# Patient Record
Sex: Female | Born: 1975 | Race: White | Hispanic: No | Marital: Single
Health system: Midwestern US, Community
[De-identification: ages and names within clinical notes are randomized; demographics above are authoritative.]

## PROBLEM LIST (undated history)

## (undated) DIAGNOSIS — M25561 Pain in right knee: Secondary | ICD-10-CM

## (undated) DIAGNOSIS — Z01818 Encounter for other preprocedural examination: Secondary | ICD-10-CM

## (undated) DIAGNOSIS — N911 Secondary amenorrhea: Secondary | ICD-10-CM

## (undated) DIAGNOSIS — C519 Malignant neoplasm of vulva, unspecified: Secondary | ICD-10-CM

## (undated) DIAGNOSIS — F5104 Psychophysiologic insomnia: Secondary | ICD-10-CM

## (undated) DIAGNOSIS — D259 Leiomyoma of uterus, unspecified: Secondary | ICD-10-CM

## (undated) DIAGNOSIS — M48062 Spinal stenosis, lumbar region with neurogenic claudication: Secondary | ICD-10-CM

## (undated) DIAGNOSIS — N939 Abnormal uterine and vaginal bleeding, unspecified: Secondary | ICD-10-CM

## (undated) DIAGNOSIS — G8918 Other acute postprocedural pain: Secondary | ICD-10-CM

## (undated) DIAGNOSIS — A630 Anogenital (venereal) warts: Secondary | ICD-10-CM

## (undated) DIAGNOSIS — Z9889 Other specified postprocedural states: Secondary | ICD-10-CM

## (undated) DIAGNOSIS — O009 Unspecified ectopic pregnancy without intrauterine pregnancy: Secondary | ICD-10-CM

## (undated) DIAGNOSIS — C801 Malignant (primary) neoplasm, unspecified: Secondary | ICD-10-CM

## (undated) HISTORY — PX: BILATERAL SALPINGECTOMY: SHX5743

## (undated) HISTORY — PX: CATARACT EXTRACTION: SUR2

## (undated) HISTORY — PX: VULVA SURGERY: SHX837

## (undated) MED ORDER — OXYCODONE-ACETAMINOPHEN 5 MG-325 MG TAB
5-325 mg | ORAL_TABLET | ORAL | Status: DC | PRN
Start: ? — End: 2016-04-23

## (undated) MED ORDER — SENNOSIDES-DOCUSATE SODIUM 8.6 MG-50 MG TAB
ORAL_TABLET | Freq: Two times a day (BID) | ORAL | Status: DC
Start: ? — End: 2016-04-23

---

## 2011-10-27 LAB — HCG URINE, QL: HCG urine, QL: NEGATIVE

## 2011-10-27 LAB — URINALYSIS W/ RFLX MICROSCOPIC
Bilirubin: NEGATIVE
Glucose: NEGATIVE MG/DL
Ketone: NEGATIVE MG/DL
Leukocyte Esterase: NEGATIVE
Nitrites: NEGATIVE
Protein: NEGATIVE MG/DL
Specific gravity: 1.02 (ref 1.003–1.030)
Urobilinogen: 0.2 EU/DL (ref 0.2–1.0)
pH (UA): 7.5 (ref 5.0–8.0)

## 2011-10-27 LAB — CHLAMYDIA/GC PCR
Chlamydia amplified: NEGATIVE
N. gonorrhea, amplified: NEGATIVE

## 2011-10-27 LAB — URINE MICROSCOPIC ONLY
Bacteria: NEGATIVE /HPF
RBC: 4 /HPF (ref 0–5)
WBC: 4 /HPF (ref 0–4)

## 2011-10-27 LAB — WET PREP: Wet prep: NONE SEEN

## 2013-03-07 ENCOUNTER — Encounter

## 2013-03-07 LAB — TYPE AND SCREEN
ABO/Rh: O POS
Antibody Screen: NEGATIVE

## 2013-03-07 LAB — BETA HCG, QT
Beta HCG, QT: <2 m[IU]/mL (ref 0–10)
hCG Quant: <2 m[IU]/mL (ref 0–10)

## 2013-03-07 LAB — METABOLIC PANEL, BASIC
Anion gap: 4 mmol/L (ref 3.0–18)
BUN/Creatinine ratio: 11 — ABNORMAL LOW (ref 12–20)
BUN: 8 MG/DL (ref 7.0–18)
CO2: 30 mmol/L (ref 21–32)
Calcium: 8.3 MG/DL — ABNORMAL LOW (ref 8.5–10.1)
Chloride: 105 mmol/L (ref 100–108)
Creatinine: 0.75 MG/DL (ref 0.6–1.3)
GFR est AA: >60 mL/min/{1.73_m2} (ref >60)
GFR est non-AA: >60 mL/min/{1.73_m2} (ref >60)
Glucose: 89 mg/dL (ref 74–99)
Potassium: 3.5 mmol/L (ref 3.5–5.5)
Sodium: 139 mmol/L (ref 136–145)

## 2013-03-07 LAB — TYPE & SCREEN
ABO/Rh(D): O POS
Antibody screen: NEGATIVE

## 2013-03-07 LAB — EKG, 12 LEAD, INITIAL
Atrial Rate: 72 {beats}/min
Calculated P Axis: 16 degrees
Calculated R Axis: 55 degrees
Calculated T Axis: 21 degrees
Diagnosis: NORMAL
P-R Interval: 146 ms
Q-T Interval: 424 ms
QRS Duration: 86 ms
QTC Calculation (Bezet): 464 ms
Ventricular Rate: 72 {beats}/min

## 2013-03-07 LAB — CBC W/O DIFF
HCT: 37.5 % (ref 35.0–45.0)
HGB: 13 g/dL (ref 12.0–16.0)
MCH: 31 PG (ref 24.0–34.0)
MCHC: 34.7 g/dL (ref 31.0–37.0)
MCV: 89.3 FL (ref 74.0–97.0)
MPV: 9 FL — ABNORMAL LOW (ref 9.2–11.8)
PLATELET: 220 10*3/uL (ref 135–420)
RBC: 4.2 M/uL (ref 4.20–5.30)
RDW: 12.5 % (ref 11.6–14.5)
WBC: 8.9 10*3/uL (ref 4.6–13.2)

## 2013-03-09 NOTE — H&P (Signed)
Requesting Physician:  Fonnie Birkenhead. Audree Camel, M.D.     HISTORY OF PRESENT ILLNESS:     This is a 37 year old woman seen at the request of Dr. Audree Camel secondary to the recent diagnosis of squamous cell carcinoma of the vulva.  The patient says that she noticed a lesion on the right side of her vulva one to two years ago.  Initially it was a raised lesion that she noticed when washing.  She did show it to one or more doctors and had various diagnoses given and treatments offered, none of which included a biopsy.  She became symptomatic in the last few months with itching and discomfort.  She went to Dr. Audree Camel, who performed a biopsy on February 25th of this year revealing moderately differentiated invasive squamous cell carcinoma with a depth of invasion of at least 2.70mm.        PAST MEDICAL HISTORY:     Morbid obesity.     PAST SURGICAL HISTORY:     1.  C-section X 2.  2.  Bilateral fallopian tube removal on separate occasions.     FAMILY HISTORY:     Negative for malignancy.     SOCIAL HISTORY:     The patient is married and has three daughters and a son ranging from 65 to 89 years old.  She works nightshift as a Conservation officer, nature.  She is a smoker with occasional alcohol intake.         REVIEW OF SYSTEMS:   CONSTITUTIONAL: No fever, chills, anorexia, weight loss, weakness, or sleep disturbance.  CARDIOVASCULAR: No chest pain, palpitations, syncope, or claudication.  RESPIRATORY:  No cough, shortness of breath, hemoptysis, or orthopnea.  GI:  No nausea, vomiting, frequency or caliber of stools, blood in the stools, or diarrhea.  INTEGUMENTARY (skin, breasts): No breast pain, lumps, nipple discharge, or axillary lumps.  The patient notes no rash or skin irritation systemically.  ENDOCRINE:  No cold intolerance, excessive fatigue, or sleep disturbance.  HEM/LYMPH:  No anemia, easy bruising, history of bleeding disorders, or lymphedema.  GU:  No urinary frequency, dysuria, hematuria, or history of stones.  NEURO:  No numbness or  focal weakness.  No tremors.  No problems with voiding or defecation.  PSYCHE:  No symptoms of depression or anxiety.  No hallucinations or symptoms of psychosis.     EXAMINATION:                      CONSTITUTIONAL:  The patient is in no acute distress.  She is alert and oriented times three.  EYES:  No lesions are present.  Pupils are equal.  The sclera are non-icteric.  NECK:  The neck is supple with a normal thyroid.  HEM/LYMPH:  There is no palpable lymphadenopathy in the cervical, supraclavicular, or inguinal regions.  RESPIRATORY:  The chest is clear to auscultation and percussion bilaterally.  CARDIOVASCULAR:  The heart is regular and without murmur.    BREASTS:  The breasts are symmetrical without dimpling, discharge, masses, or   axillary adenopathy bilaterally.  There is nodular tender breast tissue in the upper outer   quadrant bilaterally.  GASTROINTESTINAL: The abdomen is soft and nontender.  No masses are palpable.  No organomegaly is appreciated.  The liver and spleen are not distinctly palpable.  GENITOURINARY: Careful inspection of the vulva reveals a raised lesion with a healing biopsy site slightly posterior on the right labia majora approaching the introitus.  It has a surrounding lesion, which  appears to be pre-invasive.  It is approximately 2cm from the midline posteriorly.  No other lesions are seen.  There is no palpable inguinal adenopathy.  MUSCULOSKELETAL:  Extremities are without edema or significant varicosities.     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  She tells me that recent Pap smears have been normal.       PLAN:       I reviewed the findings with the patient and drew her a diagram to help her understand the relevant anatomy.  I recommended radical hemivulvectomy on the affected side and I drew diagrams to help her understand what that would involve and what would be removed.  She will also need right  inguinal lymph node dissection.  I explained the importance of reviewing the final pathology report together eight to ten days after surgery with a small chance of the need for additional treatment.  The details, risks, and postoperative recovery were reviewed.  We discussed the need for drainage of the inguinal incision.  Questions were answered to her satisfaction and we have made plans to proceed in the immediate future.       cc:       Fonnie Birkenhead. Audree Camel, M.D.

## 2013-03-09 NOTE — H&P (View-Only) (Signed)
Requesting Physician:  Morris M. Elstein, M.D.     HISTORY OF PRESENT ILLNESS:     This is a 36-year-old woman seen at the request of Dr. Elstein secondary to the recent diagnosis of squamous cell carcinoma of the vulva.  The patient says that she noticed a lesion on the right side of her vulva one to two years ago.  Initially it was a raised lesion that she noticed when washing.  She did show it to one or more doctors and had various diagnoses given and treatments offered, none of which included a biopsy.  She became symptomatic in the last few months with itching and discomfort.  She went to Dr. Elstein, who performed a biopsy on February 25th of this year revealing moderately differentiated invasive squamous cell carcinoma with a depth of invasion of at least 2.5mm.        PAST MEDICAL HISTORY:     Morbid obesity.     PAST SURGICAL HISTORY:     1.  C-section X 2.  2.  Bilateral fallopian tube removal on separate occasions.     FAMILY HISTORY:     Negative for malignancy.     SOCIAL HISTORY:     The patient is married and has three daughters and a son ranging from 4 to 14 years old.  She works nightshift as a cashier.  She is a smoker with occasional alcohol intake.         REVIEW OF SYSTEMS:   CONSTITUTIONAL: No fever, chills, anorexia, weight loss, weakness, or sleep disturbance.  CARDIOVASCULAR: No chest pain, palpitations, syncope, or claudication.  RESPIRATORY:  No cough, shortness of breath, hemoptysis, or orthopnea.  GI:  No nausea, vomiting, frequency or caliber of stools, blood in the stools, or diarrhea.  INTEGUMENTARY (skin, breasts): No breast pain, lumps, nipple discharge, or axillary lumps.  The patient notes no rash or skin irritation systemically.  ENDOCRINE:  No cold intolerance, excessive fatigue, or sleep disturbance.  HEM/LYMPH:  No anemia, easy bruising, history of bleeding disorders, or lymphedema.  GU:  No urinary frequency, dysuria, hematuria, or history of stones.  NEURO:  No numbness or  focal weakness.  No tremors.  No problems with voiding or defecation.  PSYCHE:  No symptoms of depression or anxiety.  No hallucinations or symptoms of psychosis.     EXAMINATION:                      CONSTITUTIONAL:  The patient is in no acute distress.  She is alert and oriented times three.  EYES:  No lesions are present.  Pupils are equal.  The sclera are non-icteric.  NECK:  The neck is supple with a normal thyroid.  HEM/LYMPH:  There is no palpable lymphadenopathy in the cervical, supraclavicular, or inguinal regions.  RESPIRATORY:  The chest is clear to auscultation and percussion bilaterally.  CARDIOVASCULAR:  The heart is regular and without murmur.    BREASTS:  The breasts are symmetrical without dimpling, discharge, masses, or   axillary adenopathy bilaterally.  There is nodular tender breast tissue in the upper outer   quadrant bilaterally.  GASTROINTESTINAL: The abdomen is soft and nontender.  No masses are palpable.  No organomegaly is appreciated.  The liver and spleen are not distinctly palpable.  GENITOURINARY: Careful inspection of the vulva reveals a raised lesion with a healing biopsy site slightly posterior on the right labia majora approaching the introitus.  It has a surrounding lesion, which   appears to be pre-invasive.  It is approximately 2cm from the midline posteriorly.  No other lesions are seen.  There is no palpable inguinal adenopathy.  MUSCULOSKELETAL:  Extremities are without edema or significant varicosities.     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  She tells me that recent Pap smears have been normal.       PLAN:       I reviewed the findings with the patient and drew her a diagram to help her understand the relevant anatomy.  I recommended radical hemivulvectomy on the affected side and I drew diagrams to help her understand what that would involve and what would be removed.  She will also need right  inguinal lymph node dissection.  I explained the importance of reviewing the final pathology report together eight to ten days after surgery with a small chance of the need for additional treatment.  The details, risks, and postoperative recovery were reviewed.  We discussed the need for drainage of the inguinal incision.  Questions were answered to her satisfaction and we have made plans to proceed in the immediate future.       cc:       Morris M. Elstein, M.D.

## 2013-03-13 ENCOUNTER — Inpatient Hospital Stay: Payer: MEDICAID

## 2013-03-13 MED ADMIN — oxyCODONE-acetaminophen (PERCOCET) 5-325 mg per tablet 2 Tab: ORAL | @ 22:00:00 | NDC 68084035511

## 2013-03-13 MED ADMIN — bupivacaine (PF) (MARCAINE) 0.5 % (5 mg/mL) injection: SUBCUTANEOUS | @ 19:00:00 | NDC 00409116202

## 2013-03-13 MED ADMIN — lactated ringers infusion: INTRAVENOUS | @ 20:00:00 | NDC 00409795309

## 2013-03-13 MED ADMIN — 0.45% sodium chloride with KCl 20 mEq/L infusion: INTRAVENOUS | @ 21:00:00 | NDC 00338070434

## 2013-03-13 MED ADMIN — lactated ringers infusion: INTRAVENOUS | @ 16:00:00 | NDC 00409795309

## 2013-03-13 MED ADMIN — morphine 2 mg/mL injection: INTRAVENOUS | @ 21:00:00 | NDC 00409189001

## 2013-03-13 MED ADMIN — HYDROmorphone (DILAUDID) injection 0.5 mg: INTRAVENOUS | @ 16:00:00 | NDC 00641012121

## 2013-03-13 MED ADMIN — HYDROmorphone (DILAUDID) injection 0.5 mg: INTRAVENOUS | @ 20:00:00 | NDC 00641012121

## 2013-03-13 MED ADMIN — fentaNYL citrate (PF) injection 25 mcg: INTRAVENOUS | @ 20:00:00 | NDC 00409909422

## 2013-03-13 MED ADMIN — sodium chloride irrigation 0.9 % 1,000 mL Irrigation: @ 18:00:00 | NDC 00338004804

## 2013-03-13 MED ADMIN — acetaminophen (OFIRMEV) infusion 1,000 mg: INTRAVENOUS | @ 17:00:00 | NDC 43825010201

## 2013-03-13 MED ADMIN — diphenhydrAMINE (BENADRYL) injection 25 mg: INTRAVENOUS | @ 20:00:00 | NDC 63323066401

## 2013-03-13 MED ADMIN — famotidine (PEPCID) tablet 20 mg: ORAL | @ 16:00:00 | NDC 68084017211

## 2013-03-13 MED ADMIN — cefoTEtan (CEFOTAN) 2 g in 0.9% sodium chloride (MBP/ADV) 50 mL MBP: INTRAVENOUS | @ 17:00:00 | NDC 63323038620

## 2013-03-13 NOTE — Other (Signed)
TRANSFER - IN REPORT:    Verbal report received from [employer], RN(name) on Diana Salazar  being received from [employer]) for routine progression of care      Report consisted of patient???s Situation, Background, Assessment and   Recommendations(SBAR).     Information from the following report(s) SBAR, Kardex, Intake/Output, MAR and Recent Results was reviewed with the receiving nurse.    Opportunity for questions and clarification was provided.      Assessment completed upon patient???s arrival to unit and care assumed.

## 2013-03-13 NOTE — Brief Op Note (Signed)
BRIEF OPERATIVE NOTE    Date of Procedure: 03/13/2013   Preoperative Diagnosis: vulvar cancer  Postoperative Diagnosis: vulvar cancer    Procedure(s):  right radical hemivulvectomy,right inguinal node dissection  Surgeon(s) and Role:     * Vanessa Ralphs, MD - Primary  Anesthesia: General   Estimated Blood Loss: 150cc  Specimens:   ID Type Source Tests Collected by Time Destination   1 : right inguinal lymph node Preservative Lymph Node  Vanessa Ralphs, MD 03/13/2013 1424 Pathology   2 : right radical hemivulvectomy Preservative VULVA  Vanessa Ralphs, MD 03/13/2013 1441 Pathology      Findings: 5cm raised irregular lesion right introitus   Complications: none  Implants: * No implants in log *

## 2013-03-13 NOTE — Op Note (Signed)
[facility]                  [address], [place], [place]  [address]                                 OPERATIVE REPORT    PATIENT:     Diana Salazar, Diana Salazar  MRN              [id number]   DATE:       03/13/2013  BILLING:         [id number]  LOCATION:  ATTENDING:   Phoenicia Pirie C Lourie Retz, MD  SURGEON:     Keigo Whalley C Dracen Reigle, MD      SURGEON: Iziah Cates C Laurita Peron, MD    ASSISTANTS  1. Kathleen Dorfler, MD/PGY-2.  2. Roger Wright.    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    POSTOPERATIVE DIAGNOSIS: Squamous cell carcinoma of the vulva.    PROCEDURES PERFORMED  1. Right radical hemivulvectomy.  2. Right inguinal lymph node dissection.    ANESTHESIA: General.    COMPLICATIONS: None.    ESTIMATED BLOOD LOSS: 150 mL.    SPECIMENS REMOVED:      DESCRIPTION OF PROCEDURE    FINDINGS: There was a plaque-like lesion occupying the right introitus  measuring approximately 5 cm in greatest diameter. Previous biopsy had  showed squamous cell carcinoma with a depth of invasion of at least 2.5 mm.  Gross appearance suggested that a significant portion of the lesion could  be preinvasive with a portion being invasive.    TECHNIQUE: After the patient was identified in the operating room, she was  placed under general anesthesia by the anesthesia team. She was sterilely  prepped and draped in the modified lithotomy position. An incision was made  just inferior to the inguinal crease on the right side. This dissection was  carried down to the superficial fascia, and flaps were raised superiorly  and inferiorly. The borders of the lymph node basin were identified  approximately 2 cm superior to the inguinal ligament, lateral to the  anterior superior iliac spine, and medially to the adductor magnus muscle.  The lymphoid tissue was carefully elevated off of the underlying fascia.  Small blood vessels were controlled with the LigaSure. The lymph node basin  was removed in 1  piece. The cribriform fascia was not entered. There were  palpable nodes within the specimen, measuring up to 1.5 cm. No lymph nodes  remaining in the superficial space.    The wound was irrigated, and a closed suction drain was placed. The skin  was closed with staples. The legs were raised, and the lesion in the right  introitus was outlined so as to maintain a 2 cm margin from the edges of  the lesion. The skin was incised with a scalpel. Electrocautery and the  small LigaSure were used to dissect down to the fascia of the pubic ramus.  This maintained a minimum of 2-3 cm deep to the specimen. This was done  circumferentially and the specimen was removed and sent to pathology.    The wound was irrigated. The deep tissues were closed with interrupted 3-0  Vicryls. The skin was closed with running subcuticular 4-0 Vicryl.  Dermabond was applied. Marcaine 0.5% was infiltrated into both wounds. The  patient was then awakened, extubated, and transported to the recovery room  in stable condition.   All needle, sponge, and instrument counts were noted  to be correct at the end of the case.                      Charene Mccallister C Celestine Prim, MD    RCS:wmx  D: 03/13/2013 03:29 P T: 03/13/2013 03:46 P  Job#:  [id number]  CScriptDoc #:  [id number]  cc:   MORRIS Salazar ELSTEIN, MD        Trayton Szabo C Cherlyn Syring, MD

## 2013-03-13 NOTE — Progress Notes (Signed)
Post-Anesthesia Evaluation & Assessment    Visit Vitals   Item Reading   ??? BP 116/59   ??? Pulse 56   ??? Temp 98.2 ??F (36.8 ??C)   ??? Resp 18   ??? Ht 5\' 4"  (1.626 m)   ??? Wt 107.616 kg (237 lb 4 oz)   ??? BMI 40.7 kg/m2   ??? SpO2 100%       Nausea/Vomiting: no nausea and no vomiting    Post-operative hydration adequate.    Pain score (VAS): 5    Mental status & Level of consciousness: alert and oriented x 3    Neurological status: moves all extremities, sensation grossly intact    Pulmonary status: airway patent, no supplemental oxygen required    Complications related to anesthesia: none    Patient has met all discharge requirements.    Additional comments:        Leif Loflin Mancel Parsons, MD  March 13, 2013

## 2013-03-13 NOTE — Other (Signed)
TRANSFER - OUT REPORT:    Verbal report given to [facility] RN on [name]  being transferred to 2400(unit) for routine post - op       Report consisted of patient???s Situation, Background, Assessment and   Recommendations(SBAR).     Information from the following report(s) SBAR, OR Summary, Intake/Output and MAR was reviewed with the receiving nurse.    Opportunity for questions and clarification was provided.

## 2013-03-13 NOTE — Addendum Note (Signed)
Addended by: Vanessa Ralphs on: 03/13/2013 08:13 AM     Modules accepted: Orders

## 2013-03-13 NOTE — Interval H&P Note (Signed)
H&P Update:  Diana Salazar was seen and examined.  History and physical has been reviewed. There have been no significant clinical changes since the completion of the originally dated History and Physical.    Signed By: Vanessa Ralphs, MD     March 13, 2013 1:01 PM

## 2013-03-13 NOTE — Progress Notes (Signed)
Pt received from PACU; requesting pain medication at this time - medicated for pain.  Pt assessed; denies nausea.  Pt oriented to hospital room/environment.  Safety assessed; call bell within reach.  Will continue to monitor pt.  Dell Ponto, RN     03/13/2013; 6:30 PM - Pt requesting order for Colace r/t hx of constipation post surgery; GYN resident paged.  Dell Ponto, RN

## 2013-03-13 NOTE — Progress Notes (Signed)
Chaplain conducted an initial consultation and Spiritual Assessment forRhiannon HULDA Salazar, who is a 36 y.o.,female. Patient???s Primary Language is: [place].   According to the patient???s EMR Religious Affiliation is: Non denominational.     The reason the Patient came to the hospital is:   Patient Active Problem List    Diagnosis Date Noted   ??? Vulvar cancer 03/13/2013        The Chaplain provided the following Interventions:  Initiated a relationship of care and support.   Explored issues of faith, belief, spirituality and religious/ritual needs while hospitalized.  Listened empathically.  Provided chaplaincy education.  Provided information about Spiritual Care Services.  Offered prayer and assurance of continued prayers on patients behalf.   Chart reviewed.    The following outcomes were achieved:  Patient shared limited information about both their medical narrative and spiritual journey/beliefs.  Patient processed feeling about current hospitalization.  Patient expressed gratitude for pastoral care visit.    Assessment:  Patient does not have any religious/cultural needs that will affect patient???s preferences in health care.  Patient did not indicate any spiritual or religious issues which require Spiritual Care Services interventions at this time.     Plan:  Chaplains will continue to follow and will provide pastoral care on an as needed/requested basis.  Chaplain recommends bedside caregivers page chaplain on duty if patient shows signs of acute spiritual or emotional distress.      Chaplain Diana Salazar, MDiv,   Board Certified Chaplain  [phone] - Office

## 2013-03-13 NOTE — Other (Signed)
Bedside and Verbal shift change report given to Phyllis S., RN (oncoming nurse) by Christina E Phillips, RN  (offgoing nurse).  Report given with SBAR, Kardex, Intake/Output, MAR and Recent Results.

## 2013-03-13 NOTE — Other (Signed)
1516 Patient received from OR, connected to monitor. Assessment complete

## 2013-03-13 NOTE — Unmapped (Signed)
Formatting of this note might be different from the original.  1516 Patient received from OR, connected to monitor. Assessment complete      Electronically signed by Breck CoonsVentus, Nancy, RN at 03/13/2013  3:19 PM EDT

## 2013-03-13 NOTE — Progress Notes (Signed)
Formatting of this note might be different from the original.  Pt received from PACU; requesting pain medication at this time - medicated for pain.  Pt assessed; denies nausea.  Pt oriented to hospital room/environment.  Safety assessed; call bell within reach.  Will continue to monitor pt.  Dell Pontohristina E Phillips, RN     03/13/2013; 6:30 PM - Pt requesting order for Colace r/t hx of constipation post surgery; GYN resident paged.  Dell Pontohristina E Phillips, RN   Electronically signed by Dell PontoPhillips, Christina E, RN at 03/13/2013  6:42 PM EDT

## 2013-03-13 NOTE — Interval H&P Note (Signed)
Formatting of this note is different from the original.  H&P Update:  Diana Salazar was seen and examined.  History and physical has been reviewed. There have been no significant clinical changes since the completion of the originally dated History and Physical.    Signed By: Vanessa RalphsOBERT C SQUATRITO, MD     March 13, 2013 1:01 PM      Electronically signed by Vanessa RalphsSquatrito, Robert C, MD at 03/13/2013  1:01 PM EDT    Source Note - Vanessa RalphsSquatrito, Robert C, MD - 03/09/2013 12:18 PM EDT  Formatting of this note might be different from the original.  Requesting Physician:  Fonnie BirkenheadMorris M. Audree CamelElstein, M.D.    HISTORY OF PRESENT ILLNESS:    This is a 37 year old woman seen at the request of Dr. Audree CamelElstein secondary to the recent diagnosis of squamous cell carcinoma of the vulva.  The patient says that she noticed a lesion on the right side of her vulva one to two years ago.  Initially it was a raised lesion that she noticed when washing.  She did show it to one or more doctors and had various diagnoses given and treatments offered, none of which included a biopsy.  She became symptomatic in the last few months with itching and discomfort.  She went to Dr. Audree CamelElstein, who performed a biopsy on February 25th of this year revealing moderately differentiated invasive squamous cell carcinoma with a depth of invasion of at least 2.725mm.       PAST MEDICAL HISTORY:    Morbid obesity.    PAST SURGICAL HISTORY:    1.  C-section X 2.  2.  Bilateral fallopian tube removal on separate occasions.    FAMILY HISTORY:    Negative for malignancy.    SOCIAL HISTORY:    The patient is married and has three daughters and a son ranging from 224 to 37 years old.  She works nightshift as a Conservation officer, naturecashier.  She is a smoker with occasional alcohol intake.        REVIEW OF SYSTEMS:   CONSTITUTIONAL: No fever, chills, anorexia, weight loss, weakness, or sleep disturbance.  CARDIOVASCULAR: No chest pain, palpitations, syncope, or claudication.  RESPIRATORY:  No cough, shortness of  breath, hemoptysis, or orthopnea.  GI:  No nausea, vomiting, frequency or caliber of stools, blood in the stools, or diarrhea.  INTEGUMENTARY (skin, breasts): No breast pain, lumps, nipple discharge, or axillary lumps.  The patient notes no rash or skin irritation systemically.  ENDOCRINE:  No cold intolerance, excessive fatigue, or sleep disturbance.  HEM/LYMPH:  No anemia, easy bruising, history of bleeding disorders, or lymphedema.  GU:  No urinary frequency, dysuria, hematuria, or history of stones.  NEURO:  No numbness or focal weakness.  No tremors.  No problems with voiding or defecation.  PSYCHE:  No symptoms of depression or anxiety.  No hallucinations or symptoms of psychosis.    EXAMINATION:                      CONSTITUTIONAL:  The patient is in no acute distress.  She is alert and oriented times three.  EYES:  No lesions are present.  Pupils are equal.  The sclera are non-icteric.  NECK:  The neck is supple with a normal thyroid.  HEM/LYMPH:  There is no palpable lymphadenopathy in the cervical, supraclavicular, or inguinal regions.  RESPIRATORY:  The chest is clear to auscultation and percussion bilaterally.  CARDIOVASCULAR:  The heart is regular and without  murmur.    BREASTS:  The breasts are symmetrical without dimpling, discharge, masses, or   axillary adenopathy bilaterally.  There is nodular tender breast tissue in the upper outer   quadrant bilaterally.  GASTROINTESTINAL: The abdomen is soft and nontender.  No masses are palpable.  No organomegaly is appreciated.  The liver and spleen are not distinctly palpable.  GENITOURINARY: Careful inspection of the vulva reveals a raised lesion with a healing biopsy site slightly posterior on the right labia majora approaching the introitus.  It has a surrounding lesion, which appears to be pre-invasive.  It is approximately 2cm from the midline posteriorly.  No other lesions are seen.  There is no palpable inguinal adenopathy.  MUSCULOSKELETAL:   Extremities are without edema or significant varicosities.    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  She tells me that recent Pap smears have been normal.      PLAN:      I reviewed the findings with the patient and drew her a diagram to help her understand the relevant anatomy.  I recommended radical hemivulvectomy on the affected side and I drew diagrams to help her understand what that would involve and what would be removed.  She will also need right inguinal lymph node dissection.  I explained the importance of reviewing the final pathology report together eight to ten days after surgery with a small chance of the need for additional treatment.  The details, risks, and postoperative recovery were reviewed.  We discussed the need for drainage of the inguinal incision.  Questions were answered to her satisfaction and we have made plans to proceed in the immediate future.      cc:       Fonnie Birkenhead. Audree Camel, M.D.      Electronically signed by Vanessa Ralphs, MD at 03/13/2013  8:13 AM EDT

## 2013-03-13 NOTE — Progress Notes (Signed)
Formatting of this note is different from the original.  Post-Anesthesia Evaluation & Assessment    Visit Vitals   Item Reading   ? BP 116/59   ? Pulse 56   ? Temp 98.2 F (36.8 C)   ? Resp 18   ? Ht 5\' 4"  (1.626 m)   ? Wt 107.616 kg (237 lb 4 oz)   ? BMI 40.7 kg/m2   ? SpO2 100%     Nausea/Vomiting: no nausea and no vomiting    Post-operative hydration adequate.    Pain score (VAS): 5    Mental status & Level of consciousness: alert and oriented x 3    Neurological status: moves all extremities, sensation grossly intact    Pulmonary status: airway patent, no supplemental oxygen required    Complications related to anesthesia: none    Patient has met all discharge requirements.    Additional comments:    IAN Mancel ParsonsM CONDON, MD  March 13, 2013    Electronically signed by Beatriz Stallionondon, Ian M, MD at 03/13/2013  4:23 PM EDT

## 2013-03-13 NOTE — Progress Notes (Signed)
Formatting of this note is different from the original.  Chaplain conducted an initial consultation and Spiritual Assessment forRhiannon Freeman CaldronM Salazar, who is a 36 y.o.,female. Patient?s Primary Language is: [place].   According to the patient?s EMR Religious Affiliation is: Non denominational.     The reason the Patient came to the hospital is:   Patient Active Problem List    Diagnosis Date Noted   ? 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   Explored issues of faith, belief, spirituality and religious/ritual needs while hospitalized.  Listened empathically.  Provided chaplaincy education.  Provided information about Spiritual Care Services.  Offered prayer and assurance of continued prayers on patients behalf.   Chart reviewed.    The following outcomes were achieved:  Patient shared limited information about both their medical narrative and spiritual journey/beliefs.  Patient processed feeling about current hospitalization.  Patient expressed gratitude for pastoral care visit.    Assessment:  Patient does not have any religious/cultural needs that will affect patient?s preferences in health care.  Patient did not indicate any spiritual or religious issues which require Spiritual Care Services interventions at this time.     Plan:  Chaplains will continue to follow and will provide pastoral care on an as needed/requested basis.  Chaplain recommends bedside caregivers page chaplain on duty if patient shows signs of acute spiritual or emotional distress.    Chaplain Briscoe DeutscherGil Mitchell, MDiv,   Board Certified Chaplain  [phone] - Office    Electronically signed by Marijean NiemannMitchell, Thomas at 03/13/2013  6:09 PM EDT

## 2013-03-13 NOTE — Unmapped (Signed)
Formatting of this note is different from the original.  TRANSFER - OUT REPORT:    Verbal report given to [facility] RN on [name]  being transferred to 2400(unit) for routine post - op       Report consisted of patient?s Situation, Background, Assessment and   Recommendations(SBAR).     Information from the following report(s) SBAR, OR Summary, Intake/Output and MAR was reviewed with the receiving nurse.    Opportunity for questions and clarification was provided.        Electronically signed by Breck CoonsVentus, Nancy, RN at 03/13/2013  4:06 PM EDT

## 2013-03-13 NOTE — Unmapped (Signed)
Formatting of this note is different from the original.  BRIEF OPERATIVE NOTE    Date of Procedure: 03/13/2013   Preoperative Diagnosis: vulvar cancer  Postoperative Diagnosis: vulvar cancer    Procedure(s):  right radical hemivulvectomy,right inguinal node dissection  Surgeon(s) and Role:     * Vanessa Ralphsobert C Squatrito, MD - Primary  Anesthesia: General   Estimated Blood Loss: 150cc  Specimens:   ID Type Source Tests Collected by Time Destination   1 : right inguinal lymph node Preservative Lymph Node  Vanessa Ralphsobert C Squatrito, MD 03/13/2013 1424 Pathology   2 : right radical hemivulvectomy Preservative VULVA  Vanessa Ralphsobert C Squatrito, MD 03/13/2013 1441 Pathology     Findings: 5cm raised irregular lesion right introitus   Complications: none  Implants: * No implants in log *    Electronically signed by Vanessa RalphsSquatrito, Robert C, MD at 03/13/2013  3:14 PM EDT

## 2013-03-13 NOTE — Unmapped (Signed)
Formatting of this note is different from the original.  TRANSFER - IN REPORT:    Verbal report received from [employer] Vent[employer], RN(name) on Tysheka Freeman CaldronM Christian  being received from [facility] for routine progression of care      Report consisted of patient?s Situation, Background, Assessment and   Recommendations(SBAR).     Information from the following report(s) SBAR, Kardex, Intake/Output, MAR and Recent Results was reviewed with the receiving nurse.    Opportunity for questions and clarification was provided.      Assessment completed upon patient?s arrival to unit and care assumed.       Electronically signed by Dell PontoPhillips, Christina E, RN at 03/13/2013  6:40 PM EDT

## 2013-03-13 NOTE — Unmapped (Signed)
Formatting of this note might be different from the original.  Bedside and Verbal shift change report given to Orbie PyoPhyllis S., RN (oncoming nurse) by Dell Pontohristina E Phillips, RN  (offgoing nurse).  Report given with SBAR, Kardex, Intake/Output, MAR and Recent Results.     Electronically signed by Dell PontoPhillips, Christina E, RN at 03/13/2013  7:29 PM EDT

## 2013-03-13 NOTE — Op Note (Signed)
[facility]                  [address] [place], [place]  [address]                                 OPERATIVE REPORT    PATIENT:     Diana Salazar, Diana Salazar  MRN              [id number]   DATE:       03/13/2013  BILLING:         [id number]  LOCATION:  ATTENDING:   Vanessa Ralphs, MD  SURGEON:     Vanessa Ralphs, MD      SURGEON: Vanessa Ralphs, MD    ASSISTANTS  1. Retia Passe, MD/PGY-2.  2. Parthenia Ames.    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    POSTOPERATIVE DIAGNOSIS: Squamous cell carcinoma of the vulva.    PROCEDURES PERFORMED  1. Right radical hemivulvectomy.  2. Right inguinal lymph node dissection.    ANESTHESIA: General.    COMPLICATIONS: None.    ESTIMATED BLOOD LOSS: 150 mL.    SPECIMENS REMOVED:      DESCRIPTION OF PROCEDURE    FINDINGS: There was a plaque-like lesion occupying the right introitus  measuring approximately 5 cm in greatest diameter. Previous biopsy had  showed squamous cell carcinoma with a depth of invasion of at least 2.5 mm.  Gross appearance suggested that a significant portion of the lesion could  be preinvasive with a portion being invasive.    TECHNIQUE: After the patient was identified in the operating room, she was  placed under general anesthesia by the anesthesia team. She was sterilely  prepped and draped in the modified lithotomy position. An incision was made  just inferior to the inguinal crease on the right side. This dissection was  carried down to the superficial fascia, and flaps were raised superiorly  and inferiorly. The borders of the lymph node basin were identified  approximately 2 cm superior to the inguinal ligament, lateral to the  anterior superior iliac spine, and medially to the adductor magnus muscle.  The lymphoid tissue was carefully elevated off of the underlying fascia.  Small blood vessels were controlled with the LigaSure. The lymph node basin  was removed in 1  piece. The cribriform fascia was not entered. There were  palpable nodes within the specimen, measuring up to 1.5 cm. No lymph nodes  remaining in the superficial space.    The wound was irrigated, and a closed suction drain was placed. The skin  was closed with staples. The legs were raised, and the lesion in the right  introitus was outlined so as to maintain a 2 cm margin from the edges of  the lesion. The skin was incised with a scalpel. Electrocautery and the  small LigaSure were used to dissect down to the fascia of the pubic ramus.  This maintained a minimum of 2-3 cm deep to the specimen. This was done  circumferentially and the specimen was removed and sent to pathology.    The wound was irrigated. The deep tissues were closed with interrupted 3-0  Vicryls. The skin was closed with running subcuticular 4-0 Vicryl.  Dermabond was applied. Marcaine 0.5% was infiltrated into both wounds. The  patient was then awakened, extubated, and transported to the recovery room  in stable condition.  All needle, sponge, and instrument counts were noted  to be correct at the end of the case.                      Vanessa Ralphs, MD    RCS:wmx  D: 03/13/2013 03:29 P T: 03/13/2013 03:46 P  Job#:  [id number]  CScriptDoc #:  [id number]  cc:   Charlies Constable, MD        Vanessa Ralphs, MD

## 2013-03-14 LAB — METABOLIC PANEL, BASIC
Anion gap: 7 mmol/L (ref 3.0–18)
BUN/Creatinine ratio: 10 — ABNORMAL LOW (ref 12–20)
BUN: 10 MG/DL (ref 7.0–18)
CO2: 25 mmol/L (ref 21–32)
Calcium: 7.2 MG/DL — ABNORMAL LOW (ref 8.5–10.1)
Chloride: 109 mmol/L — ABNORMAL HIGH (ref 100–108)
Creatinine: 0.96 MG/DL (ref 0.6–1.3)
GFR est AA: >60 mL/min/{1.73_m2} (ref >60)
GFR est non-AA: >60 mL/min/{1.73_m2} (ref >60)
Glucose: 116 mg/dL — ABNORMAL HIGH (ref 74–99)
Potassium: 3.5 mmol/L (ref 3.5–5.5)
Sodium: 141 mmol/L (ref 136–145)

## 2013-03-14 LAB — CBC W/O DIFF
HCT: 34.1 % — ABNORMAL LOW (ref 35.0–45.0)
HGB: 11.3 g/dL — ABNORMAL LOW (ref 12.0–16.0)
MCH: 30.5 PG (ref 24.0–34.0)
MCHC: 33.1 g/dL (ref 31.0–37.0)
MCV: 92.2 FL (ref 74.0–97.0)
MPV: 9.3 FL (ref 9.2–11.8)
PLATELET: 198 10*3/uL (ref 135–420)
RBC: 3.7 M/uL — ABNORMAL LOW (ref 4.20–5.30)
RDW: 12.9 % (ref 11.6–14.5)
WBC: 8.7 10*3/uL (ref 4.6–13.2)

## 2013-03-14 MED ADMIN — morphine injection 2 mg: INTRAVENOUS | @ 04:00:00 | NDC 00409189001

## 2013-03-14 MED ADMIN — pneumococcal 23-valent (PNEUMOVAX 23) injection 0.5 mL: INTRAMUSCULAR | @ 17:00:00 | NDC 00006494301

## 2013-03-14 MED ADMIN — diphenhydrAMINE (BENADRYL) injection 25 mg: INTRAVENOUS | NDC 63323066401

## 2013-03-14 MED ADMIN — phenazopyridine (PYRIDIUM) tablet 100 mg: ORAL | @ 15:00:00 | NDC 68084029211

## 2013-03-14 MED ADMIN — morphine injection 2 mg: INTRAVENOUS | @ 12:00:00 | NDC 00409189001

## 2013-03-14 MED ADMIN — oxyCODONE-acetaminophen (PERCOCET) 5-325 mg per tablet 2 Tab: ORAL | @ 01:00:00 | NDC 68084035511

## 2013-03-14 MED ADMIN — 0.45% sodium chloride with KCl 20 mEq/L infusion: INTRAVENOUS | @ 05:00:00 | NDC 00338070434

## 2013-03-14 MED ADMIN — oxyCODONE-acetaminophen (PERCOCET) 5-325 mg per tablet 2 Tab: ORAL | @ 09:00:00 | NDC 68084035511

## 2013-03-14 MED ADMIN — diphenhydrAMINE (BENADRYL) injection 25 mg: INTRAVENOUS | @ 08:00:00 | NDC 63323066401

## 2013-03-14 MED ADMIN — influenza vaccine 2013-14(3yr+)(PF) (FLUZONE,FLUARIX) injection 0.5 mL: INTRAMUSCULAR | @ 15:00:00 | NDC 49281001388

## 2013-03-14 MED ADMIN — morphine injection 2 mg: INTRAVENOUS | @ 16:00:00 | NDC 00409189001

## 2013-03-14 MED ADMIN — oxyCODONE-acetaminophen (PERCOCET) 5-325 mg per tablet 1 Tab: ORAL | @ 17:00:00 | NDC 68084035511

## 2013-03-14 MED ADMIN — diphenhydrAMINE (BENADRYL) injection 25 mg: INTRAVENOUS | @ 12:00:00 | NDC 63323066401

## 2013-03-14 MED ADMIN — diphenhydrAMINE (BENADRYL) injection 25 mg: INTRAVENOUS | @ 04:00:00 | NDC 63323066401

## 2013-03-14 MED ADMIN — morphine injection 2 mg: INTRAVENOUS | NDC 00409189001

## 2013-03-14 MED ADMIN — enoxaparin (LOVENOX) injection 40 mg: SUBCUTANEOUS | @ 08:00:00 | NDC 00075801401

## 2013-03-14 MED ADMIN — oxyCODONE-acetaminophen (PERCOCET) 5-325 mg per tablet 1 Tab: ORAL | @ 13:00:00 | NDC 68084035511

## 2013-03-14 MED ADMIN — oxyCODONE-acetaminophen (PERCOCET) 5-325 mg per tablet 2 Tab: ORAL | @ 05:00:00 | NDC 68084035511

## 2013-03-14 MED ADMIN — diphenhydrAMINE (BENADRYL) injection 25 mg: INTRAVENOUS | @ 16:00:00 | NDC 63323066401

## 2013-03-14 NOTE — Progress Notes (Signed)
0400 Pt able to demonstrate how to empty and re-engage the JP drain without problem

## 2013-03-14 NOTE — Progress Notes (Signed)
GYN Oncology Progress Note    Pt sitting in bed, doing well. Pain controlled with percocet. Tolerating regular diet, no N/V. Has not been OOB yet. Reports Foley catheter is causing her discomfort. JP drain in place. Denies vaginal bleeding    BP 96/58   Pulse 67   Temp(Src) 98.8 ??F (37.1 ??C)   Resp 16   Ht 5\' 4"  (1.626 m)   Wt 107.616 kg (237 lb 4 oz)   BMI 40.7 kg/m2   SpO2 97%   LMP 02/27/2013  Gen-NAD  CV- RRR  Lungs- CTAB  Abd- soft, mild distention, +BS, appropriately tender, incision with staples in place c/d/i  Ext- SCDs in place, no edema      Intake/Output Summary (Last 24 hours) at 03/14/13 0547  Last data filed at 03/14/13 0545   Gross per 24 hour   Intake   1460 ml   Output   1390 ml   Net     70 ml     JP drain 80cc since placement      Lab Results   Component Value Date/Time    WBC 8.7 03/14/2013  4:15 AM    HGB 11.3 03/14/2013  4:15 AM    HCT 34.1 03/14/2013  4:15 AM    PLATELET 198 03/14/2013  4:15 AM    MCV 92.2 03/14/2013  4:15 AM     Lab Results   Component Value Date/Time    Sodium 141 03/14/2013  4:15 AM    Potassium 3.5 03/14/2013  4:15 AM    Chloride 109 03/14/2013  4:15 AM    CO2 25 03/14/2013  4:15 AM    Anion gap 7 03/14/2013  4:15 AM    Glucose 116 03/14/2013  4:15 AM    BUN 10 03/14/2013  4:15 AM    Creatinine 0.96 03/14/2013  4:15 AM    BUN/Creatinine ratio 10 03/14/2013  4:15 AM    GFR est non-AA >60 03/14/2013  4:15 AM    Calcium 7.2 03/14/2013  4:15 AM    GFR est AA >60 03/14/2013  4:15 AM       POD1 with squamous cell carcinoma of the vulva treated with right radical +hemivulvectomy, right inguinal lymph node dissection  -Doing well postoperatively, VSS, labs stable  -Discontinue Foley catheter, pyridium for discomfort  -Tolerating regular diet  -Pain controlled with percocet  -JP drain in place  -Stable for home discharge today after voiding trial    Eloise Harman, PGY-2  GYN Oncology Pager: [phone]

## 2013-03-14 NOTE — Progress Notes (Addendum)
0000 Pt c/o foley burning.  Repostioned and flushed foley.  Emptied 550 amber urine.  0030 40 cc clear yellow urine.  Pt stated a decrease in the burning.  Continue to monitor

## 2013-03-14 NOTE — Other (Signed)
Bedside and Verbal shift change report given to Edmonia James RN (oncoming nurse) by Geanie Cooley, RN (offgoing nurse).  Report given with SBAR, Kardex, Procedure Summary, Intake/Output and MAR.

## 2013-03-14 NOTE — Progress Notes (Signed)
0800  Received  Pt alert, due to void, pain under control at this time.  0950  Voided, encourage pt to walk but wanted it later.  1300  Medicated prior to discharge , discharge instructions given, going home with a JP aware to empty  And record, instructed about pericare and to sit on a soft dough nut.  1340  Discharge in good spirit, accompanied downstairs.

## 2013-03-14 NOTE — Progress Notes (Signed)
Progress Note    Patient: Diana Salazar MRN: [id number]  SSN: [id number]    Date of Birth: [birth year]  Age: 37 y.o.  Sex: female      Admit Date: 03/13/2013    LOS: 1 day     Subjective:     Pt ambulating to bathroom on exam. Voiding without difficulty. Pain well controlled with current regimen. Tolerating regular diet. Denies N/V. JP drain in place.     Objective:     Filed Vitals:    03/13/13 2006 03/14/13 0029 03/14/13 0425 03/14/13 0809   BP: 101/55 102/65 96/58 95/58    Pulse: 68 75 67 63   Temp: 98.6 ??F (37 ??C) 98.5 ??F (36.9 ??C) 98.8 ??F (37.1 ??C)    Resp: 16 16 16 18    Height:       Weight:       SpO2: 98% 99% 97% 96%        Intake and Output:  Current Shift:    Last three shifts: 03/17 1900 - 03/19 0659  In: 1460 [P.O.:360; I.V.:1100]  Out: 1390 [Urine:1250; Drains:40]    Physical Exam:   Constitutional: NAD, Alert.    Cardiovascular: RRR   Respiratory: CTAB   Abdomen: Soft, non-distended. Non tender.   GU: Bandaging at right inguinal crease, c/d/i. JP drain site c/d/i. With serosanguinous drainage in bulb.    Extremities: No edema, warm.        Lab/Data Review:  Recent Results (from the past 24 hour(s))   CBC W/O DIFF    Collection Time     03/14/13  4:15 AM       Result Value Range    WBC 8.7  4.6 - 13.2 K/uL    RBC 3.70 (*) 4.20 - 5.30 M/uL    HGB 11.3 (*) 12.0 - 16.0 g/dL    HCT 78.2 (*) 95.6 - 45.0 %    MCV 92.2  74.0 - 97.0 FL    MCH 30.5  24.0 - 34.0 PG    MCHC 33.1  31.0 - 37.0 g/dL    RDW 21.3  08.6 - 57.8 %    PLATELET 198  135 - 420 K/uL    MPV 9.3  9.2 - 11.8 FL   METABOLIC PANEL, BASIC    Collection Time     03/14/13  4:15 AM       Result Value Range    Sodium 141  136 - 145 mmol/L    Potassium 3.5  3.5 - 5.5 mmol/L    Chloride 109 (*) 100 - 108 mmol/L    CO2 25  21 - 32 mmol/L    Anion gap 7  3.0 - 18 mmol/L    Glucose 116 (*) 74 - 99 mg/dL    BUN 10  7.0 - 18 MG/DL    Creatinine 4.69  0.6 - 1.3 MG/DL    BUN/Creatinine ratio 10 (*) 12 - 20      GFR est AA >60  >60 ml/min/1.45m2     GFR est non-AA >60  >60 ml/min/1.26m2    Calcium 7.2 (*) 8.5 - 10.1 MG/DL   HPROBLPrincipal Problem:    Vulvar cancer (03/13/2013)        Assessment/Plan:     37 y.o. F POD#1 s/p right radical hemivulvectomy and right inguinal lymph node dissection for squamous cell carcinoma of the vulva. Labs reviewed, VSS.    - Pain well controlled with IV morphine + PO percocet. Discussed with pt and nurse transition  to PO percocet for discharge.  - Tolerating regular diet.  - Voiding spontaneously.   - DVT ppx: Lovenox and SCDs.   - OOB and ambulation. Continue routine post-op care     - Disposition: Patient stable for discharge. Post-op restrictions reviewed with patient. To be discharged home with JP drain in place. Pt educated on drain care.       Signed By: Eusebio Me, PA-C  [facility]  [phone], between hours of 7am-5pm, Monday-Friday. Outside of these hours, please page GYN ONC resident at [phone].    March 14, 2013

## 2013-03-14 NOTE — Unmapped (Signed)
Formatting of this note might be different from the original.  Bedside and Verbal shift change report given to Edmonia JamesErna Bardi RN (oncoming nurse) by Geanie CooleyPHYLLIS E SZYMANSKI, RN (offgoing nurse).  Report given with SBAR, Kardex, Procedure Summary, Intake/Output and MAR.   Electronically signed by Geanie CooleySzymanski, Phyllis E, RN at 03/14/2013  7:41 AM EDT

## 2013-03-14 NOTE — Progress Notes (Signed)
Formatting of this note is different from the original.  Images from the original note were not included.      Progress Note    Patient: Diana Salazar MRN: [id number]  SSN: [id number]    Date of Birth: 1976-11-14  Age: 37 y.o.  Sex: female      Admit Date: 03/13/2013    LOS: 1 day     Subjective:     Pt ambulating to bathroom on exam. Voiding without difficulty. Pain well controlled with current regimen. Tolerating regular diet. Denies N/V. JP drain in place.     Objective:     Filed Vitals:    03/13/13 2006 03/14/13 0029 03/14/13 0425 03/14/13 0809   BP: 101/55 102/65 96/58 95/58    Pulse: 68 75 67 63   Temp: 98.6 F (37 C) 98.5 F (36.9 C) 98.8 F (37.1 C)    Resp: Height:       Weight:       SpO2: 98% 99% 97% 96%       Intake and Output:  Current Shift:    Last three shifts: 03/17 1900 - 03/19 0659  In: 1460 [P.O.:360; I.V.:1100]  Out: 1390 [Urine:1250; Drains:40]    Physical Exam:   Constitutional: NAD, Alert.    Cardiovascular: RRR   Respiratory: CTAB   Abdomen: Soft, non-distended. Non tender.   GU: Bandaging at right inguinal crease, c/d/i. JP drain site c/d/i. With serosanguinous drainage in bulb.    Extremities: No edema, warm.      Lab/Data Review:  Recent Results (from the past 24 hour(s))   CBC W/O DIFF    Collection Time     03/14/13  4:15 AM       Result Value Range    WBC 8.7  4.6 - 13.2 K/uL    RBC 3.70 (*) 4.20 - 5.30 M/uL    HGB 11.3 (*) 12.0 - 16.0 g/dL    HCT 78.2 (*) 95.6 - 45.0 %    MCV 92.2  74.0 - 97.0 FL    MCH 30.5  24.0 - 34.0 PG    MCHC 33.1  31.0 - 37.0 g/dL    RDW 21.3  08.6 - 57.8 %    PLATELET 198  135 - 420 K/uL    MPV 9.3  9.2 - 11.8 FL   METABOLIC PANEL, BASIC    Collection Time     03/14/13  4:15 AM       Result Value Range    Sodium 141  136 - 145 mmol/L    Potassium 3.5  3.5 - 5.5 mmol/L    Chloride 109 (*) 100 - 108 mmol/L    CO2 25  21 - 32 mmol/L    Anion gap 7  3.0 - 18 mmol/L    Glucose 116 (*) 74 - 99 mg/dL    BUN 10  7.0 - 18 MG/DL    Creatinine 4.69   0.6 - 1.3 MG/DL    BUN/Creatinine ratio 10 (*) 12 - 20      GFR est AA >60  >60 ml/min/1.65m2    GFR est non-AA >60  >60 ml/min/1.92m2    Calcium 7.2 (*) 8.5 - 10.1 MG/DL   HPROBLPrincipal Problem:    Vulvar cancer (03/13/2013)    Assessment/Plan:     37 y.o. F POD#1 s/p right radical hemivulvectomy and right inguinal lymph node dissection for squamous cell carcinoma of the vulva. Labs reviewed, VSS.    -  Pain well controlled with IV morphine + PO percocet. Discussed with pt and nurse transition to PO percocet for discharge.  - Tolerating regular diet.  - Voiding spontaneously.   - DVT ppx: Lovenox and SCDs.   - OOB and ambulation. Continue routine post-op care     - Disposition: Patient stable for discharge. Post-op restrictions reviewed with patient. To be discharged home with JP drain in place. Pt educated on drain care.     Signed By: Eusebio MeShanti Powers, PA-C  [facility]  [phone], between hours of 7am-5pm, Monday-Friday. Outside of these hours, please page GYN ONC resident at [phone].    March 14, 2013        Electronically signed by Vanessa RalphsSquatrito, Robert C, MD at 03/15/2013  3:16 PM EDT

## 2013-03-14 NOTE — Progress Notes (Signed)
Formatting of this note might be different from the original.  0000 Pt c/o foley burning.  Repostioned and flushed foley.  Emptied 550 amber urine.  0030 40 cc clear yellow urine.  Pt stated a decrease in the burning.  Continue to monitor   Electronically signed by Geanie CooleySzymanski, Phyllis E, RN at 03/14/2013  1:35 AM EDT

## 2013-03-14 NOTE — Progress Notes (Signed)
Formatting of this note might be different from the original.  0400 Pt able to demonstrate how to empty and re-engage the JP drain without problem   Electronically signed by Geanie CooleySzymanski, Phyllis E, RN at 03/14/2013  4:23 AM EDT

## 2013-03-14 NOTE — Progress Notes (Signed)
Formatting of this note might be different from the original.  0800  Received  Pt alert, due to void, pain under control at this time.  0950  Voided, encourage pt to walk but wanted it later.  1300  Medicated prior to discharge , discharge instructions given, going home with a JP aware to empty  And record, instructed about pericare and to sit on a soft dough nut.  1340  Discharge in good spirit, accompanied downstairs.  Electronically signed by Angus SellerBaradi, Erna M, RN at 03/14/2013  1:46 PM EDT

## 2013-03-14 NOTE — Progress Notes (Signed)
Formatting of this note is different from the original.  Images from the original note were not included.    GYN Oncology Progress Note    Pt sitting in bed, doing well. Pain controlled with percocet. Tolerating regular diet, no N/V. Has not been OOB yet. Reports Foley catheter is causing her discomfort. JP drain in place. Denies vaginal bleeding    BP 96/58  Pulse 67  Temp(Src) 98.8 F (37.1 C)  Resp 16  Ht 5\' 4"  (1.626 m)  Wt 107.616 kg (237 lb 4 oz)  BMI 40.7 kg/m2  SpO2 97%  LMP 02/27/2013  Gen-NAD  CV- RRR  Lungs- CTAB  Abd- soft, mild distention, +BS, appropriately tender, incision with staples in place c/d/i  Ext- SCDs in place, no edema    Intake/Output Summary (Last 24 hours) at 03/14/13 0547  Last data filed at 03/14/13 0545   Gross per 24 hour   Intake   1460 ml   Output   1390 ml   Net     70 ml     JP drain 80cc since placement    Lab Results   Component Value Date/Time    WBC 8.7 03/14/2013  4:15 AM    HGB 11.3 03/14/2013  4:15 AM    HCT 34.1 03/14/2013  4:15 AM    PLATELET 198 03/14/2013  4:15 AM    MCV 92.2 03/14/2013  4:15 AM     Lab Results   Component Value Date/Time    Sodium 141 03/14/2013  4:15 AM    Potassium 3.5 03/14/2013  4:15 AM    Chloride 109 03/14/2013  4:15 AM    CO2 25 03/14/2013  4:15 AM    Anion gap 7 03/14/2013  4:15 AM    Glucose 116 03/14/2013  4:15 AM    BUN 10 03/14/2013  4:15 AM    Creatinine 0.96 03/14/2013  4:15 AM    BUN/Creatinine ratio 10 03/14/2013  4:15 AM    GFR est non-AA >60 03/14/2013  4:15 AM    Calcium 7.2 03/14/2013  4:15 AM    GFR est AA >60 03/14/2013  4:15 AM     POD1 with squamous cell carcinoma of the vulva treated with right radical +hemivulvectomy, right inguinal lymph node dissection  -Doing well postoperatively, VSS, labs stable  -Discontinue Foley catheter, pyridium for discomfort  -Tolerating regular diet  -Pain controlled with percocet  -JP drain in place  -Stable for home discharge today after voiding trial    Eloise HarmanGiselle Torres, PGY-2  GYN Oncology Pager:  [phone]      Electronically signed by Vanessa RalphsSquatrito, Robert C, MD at 03/15/2013  3:16 PM EDT

## 2014-03-13 NOTE — Procedures (Signed)
Formatting of this note might be different from the original.  [facility] Operative Report    Date of Operation: 03/13/2014    Pre-Operative Diagnosis: Dislocated IOL Right Eye, Cataract, visually significant left eye    Post-Operative Diagnosis: Same    Procedure: Synechialysis and repositioning of IOL Right eye and Phacoemulsification with intraocular lens insertion left eye    Surgeon: Karlene Lineman, M.D.    Attending: Dr. Gilmer Mor, M.D.    Findings: None    Complications: None    Intraocular Lens:  ZCB00 21.5D    INDICATIONS:  The patient was followed at the [facility] with a diagnosis of visually significant cataract causing a decrease in best corrected vision in the left eye, interfering with the ability to do desired activities.  The risks, benefits, alternatives, and complications were explained.  Risks include but are not limited to bleeding, infection, need for further surgery, loss of vision, loss of eye, retinal  detachment, glaucoma, hypotony and the risk of anesthesia or death. The patient expressed understanding and desired to proceed with cataract extraction with lens implantation.    DESCRIPTION OF OPERATION:  While in the preoperative holding area, informed consent was confirmed still active and the operative site was marked.  One drop of Phenylephrine, Tropicamide, Pred-G and Ocufen were placed in the affected eye.  These were repeated times three with five minute intervals between drops.  The patient was brought to the Operating Room, where she was sedated under general anesthesia.  The eyes were prepped and draped in the usual sterile fashion using Povidone-iodine.  Tetracaine drops were applied and a lid speculum was placed in the affected eye.      Attention was first directed toward the right eye. A paracentesis was created temporally. The anterior chamberr was filled with viscoat. The iris captures at the edge of the nasal haptic/optic junction was released. The lens was rotated with the haptic  in the 12-6 position. Miachol was use to constrict the pupil. A second paracentesis was placed nasally to assist in flushing the viscoat from the anterior chamber.     At this point, tobradex ointment was placed in the right eye and the speculum was removed. The patient was re-prepped and draped.     Next, attention was directed to the left eye. The eye was prepped and draped in the usual sterile fashion.     Under the operating microscope, a paracentesis was made using a Super Sharp blade.  Preservative free lidocaine and epinephrine 8:2 dilution were instilled in the anterior chamber.   An air bubble was then introduced into the anterior chamber.  Trypan Blue was instilled under the air and used to stain the anterior capsule, then well-irrigated out with balanced saline solution.  Viscoelastic was then used to inflate the anterior chamber and a 2.67mm keratome blade was used to create a clear corneal wound superiorly.    A bent needle cystotome was used to score the anterior capsule and Utrata forceps were used to create a continuous curvilinear capsulorhexis.  Balanced saline solution was used to hydrodissect and hydrodelineate the lens until it was mobile within the capsule. Phacoemulsification was then used to remove the lens nucleus in a modified divide and conquer approach.    The residual cortex and epinuclear material were removed with irrigation and aspiration.  The capsular bag was inspected and found to be free of any tears and was then re-inflated with provisc and a folded, ZCB00 21.5D intraocular lens was inserted into the  capsular bag and dialed into position.  Irrigation and aspiration were used to remove any remaining Viscoelastic material.  Balanced saline solution was used to hydrate the wounds and re-form the anterior chamber.  The intraocular pressure was assessed via palpation and determined to be appropriate.   The wounds were checked and found to be water tight.    The eye was inspected and  the pupil was noted to be round, the anterior chamber deep, and the lens in good position.  The lid speculum was removed. Tobradex Ointment were instilled into the eye, a rigid plastic shield was taped over the right and left eyes, and the patient was taken to the PACU in stable condition.    FOLLOW-UP:  The patient is to be discharged from the PACU when stable and follow up at the [facility] the following day.  If any problems arise such as increase in pain, nausea/vomiting or fever, the patient was to call immediately.    Burnadette Peter, MD,RES  (Answering Service) [phone]  03/13/2014, 2:24 PM      Electronically signed by Tamera Stands, MD at 03/19/2014  1:32 PM EDT

## 2014-03-13 NOTE — Procedures (Signed)
Formatting of this note might be different from the original.  [facility] Brief Op Note    Date of Operation: 03/13/2014    Pre-Operative Diagnosis:  Dislocated IOL Right Eye, Cataract, visually significant left eye    Post-Operative Diagnosis: Same    Procedure: Synechialysis and repositioning of IOL Right eye and  Phacoemulsification with intraocular lens insertion left eye    Surgeon: Burnadette Peter, MD,RES    Attending: Dr. Gilmer Mor, M.D.    Findings: None    Complications: None    Intraocular Lens: ZCB00 21.5D    Burnadette Peter, MD,RES  Answering Service: [phone]  03/13/2014, 2:23 PM      Electronically signed by Tamera Stands, MD at 03/19/2014  2:20 PM EDT

## 2014-03-13 NOTE — H&P (Signed)
Formatting of this note is different from the original.  History and Physical reviewed; I have examined the patient and there are no pertinent changes.  Right and Left eye examined and marked.  All questions answered, patient still wishes to proceed with synechialysis and repositioning of IOL right eye and pach/pciol left eye.    BP 134/96  Pulse 60  Temp(Src) 98.2 F (36.8 C)  Resp 21  Ht 5\' 4"  (1.626 m)  Wt 102.7 kg (226 lb 6.6 oz)  BMI 38.84 kg/m2  SpO2 100%  LMP 03/04/2014    Home Medication List - Marked as Reviewed on 03/13/14 1115   Medication Sig   flurbiprofen (OCUFEN) 0.03 % OP Drop Instill 1 Drop in affected eye(s) Once.   IBUPROFEN/DIPHENHYDRAMINE CIT (ADVIL PM PO) Take 1 Tab by Mouth Nightly As Needed.   ofloxacin (OCUFLOX) 0.3 % OP Drop Instill 1 Drop in Right eye 4 Times Daily. Start 01/13/14   ibuprofen (MOTRIN) 400 mg PO TABS Take 400 mg by Mouth Every 6 Hours As Needed.     Allergies   Allergen Reactions   ? Sulfa (Sulfonamide Antibiotics) hives     Joya Martyr, MD  Ophthalmology, PGY-4  Pager [phone]  Answering service [phone]    Electronically signed by Tamera Stands, MD at 03/19/2014  1:32 PM EDT

## 2014-06-12 NOTE — H&P (Signed)
Formatting of this note might be different from the original.  H&P reviewed. No changes noted. Will proceed with planned procedure for IOL exchange and repositioning with possible anterior vitrectomy of right eye.     BP 114/77  Pulse 53  Temp(Src) 97.7 F (36.5 C)  Resp 15  Ht 5\' 4"  (1.626 m)  Wt 106.731 kg (235 lb 4.8 oz)  BMI 40.37 kg/m2  SpO2 99%  LMP 05/19/2014    [name], MD,RES      Electronically signed by Tamera Stands, MD at 06/25/2014  3:20 PM EDT

## 2014-06-12 NOTE — Care Plan (Signed)
Formatting of this note might be different from the original.  Problem: Fall Prevention  Goal: Prevent/Manage Accidental Injury (Falls)  Consider requesting a pharmacologic review as needed.  Consider asking the MD to consult PT / OT for an evaluation.   Outcome: Progressing  Call bell within reach, siderails up, bed in low and locked position. Pt verbalizes understanding to call for help before getting up    Problem: Perioperative Period (Adult, Obstetrics)  Goal: Signs and symptoms of listed potential problems will be absent or manageable (reference (Perioperative Period (Adult, Obstetrics)) CPG)  Outcome: Progressing  Patient encouraged to verbalize any questions or concerns regarding procedure.Pt verbalizes signs, symptoms, and prevention of infections.      Electronically signed by Clarisse Gouge, RN at 06/12/2014  9:34 AM EDT

## 2014-06-12 NOTE — Procedures (Signed)
Formatting of this note might be different from the original.  [facility] Operative Report    Date of Operation: 06/12/2014    Pre-Operative Diagnosis: dislocated IOL- right eye     Post-Operative Diagnosis: Same    Procedure: lens exchange of right eye     Surgeon: Jeryl Columbia, M.D.    Attending: Dr. Gilmer Mor, M.D.    Findings: None    Complications: None    Intraocular Lens:  18.5 D   MA30AC     INDICATIONS:  The patient was followed at the [facility] with a diagnosis dislocated IOL of right eye.  The risks, benefits, alternatives, and complications were explained.  Risks include but are not limited to bleeding, infection, need for further surgery, loss of vision, loss of eye, retinal  detachment, glaucoma, hypotony and the risk of anesthesia or death. The patient expressed understanding and desired to proceed with cataract extraction with lens implantation.    DESCRIPTION OF OPERATION:  While in the preoperative holding area, informed consent was confirmed still active and the operative site was marked.  One drop of Phenylephrine, Tropicamide, Pred-G and Ocufen were placed in the affected eye.  These were repeated times three with five minute intervals between drops.  The patient was brought to the Operating Room.  The eye was prepped and draped in the usual sterile fashion using Povidone-iodine.  Tetracaine drops were applied and a lid speculum was placed in the affected eye.      Under the operating microscope, a paracentesis was made using a Super Sharp blade.  Preservative free lidocaine and epinephrine 8:2 dilution were instilled in the anterior chamber.    Viscoelastic was then used to inflate the anterior chamber and a 2.30mm keratome blade was used to create a clear corneal wound.  Iris scisors were used to cut the 3 piece lens.  Mcphersons were used to pull the IOL out of the eye.  Viscoat was used to fill the [facility]  A MA30AC lens was injected into the sulcus and dialed into position.  The haptics  were confirmed to be in good position.  I and A was used to remove any excess viscoat.  Michol was injected and the lens was confirmed to be in good position.  Balanced saline solution was used to hydrate the wounds and re-form the anterior chamber.  The intraocular pressure was assessed via palpation and determined to be appropriate.       The eye was inspected and the pupil was noted to be round, the anterior chamber deep, and the lens in good position.  The lid speculum was removed.  Ocuflox drops and Pred-G Ointment were instilled into the eye, a rigid plastic shield was taped over the right eye, and the patient was taken to the PACU in stable condition.    FOLLOW-UP:  The patient is to be discharged from the PACU when stable and follow up at the [facility] the following day.  If any problems arise such as increase in pain, nausea/vomiting or fever, the patient was to call immediately.    [name], MD,RES  06/12/2014, 11:03 AM    Electronically signed by Tamera Stands, MD at 06/25/2014  3:20 PM EDT

## 2015-01-09 ENCOUNTER — Emergency Department: Payer: Self-pay | Admitting: Emergency Medicine

## 2015-01-09 LAB — URINALYSIS, COMPLETE
Bilirubin,UR: NEGATIVE
Glucose,UR: NEGATIVE mg/dL (ref 0–75)
KETONE: NEGATIVE
Leukocyte Esterase: NEGATIVE
NITRITE: NEGATIVE
PH: 5 (ref 4.5–8.0)
PROTEIN: NEGATIVE
RBC,UR: 10 /HPF (ref 0–5)
Specific Gravity: 1.025 (ref 1.003–1.030)
Squamous Epithelial: 5
WBC UR: 1 /HPF (ref 0–5)

## 2015-01-09 LAB — GC/CHLAMYDIA PROBE AMP

## 2015-01-09 LAB — PREGNANCY, URINE: Pregnancy Test, Urine: NEGATIVE m[IU]/mL

## 2015-01-09 LAB — WET PREP, GENITAL

## 2016-04-20 ENCOUNTER — Encounter: Admit: 2016-04-20 | Primary: Family Medicine

## 2016-04-20 ENCOUNTER — Ambulatory Visit: Attending: Physician Assistant | Primary: Family Medicine

## 2016-04-20 ENCOUNTER — Encounter: Attending: Physician Assistant | Primary: Family Medicine

## 2016-04-20 DIAGNOSIS — M25561 Pain in right knee: Secondary | ICD-10-CM

## 2016-04-20 MED ORDER — ESCITALOPRAM 10 MG TAB
10 mg | ORAL_TABLET | Freq: Every day | ORAL | 0 refills | Status: DC
Start: 2016-04-20 — End: 2016-08-06

## 2016-04-20 MED ORDER — CEPHALEXIN 500 MG CAP
500 mg | ORAL_CAPSULE | Freq: Four times a day (QID) | ORAL | 0 refills | Status: AC
Start: 2016-04-20 — End: 2016-04-25

## 2016-04-20 MED ORDER — MUPIROCIN 2 % OINTMENT
2 % | Freq: Every day | CUTANEOUS | 0 refills | Status: DC
Start: 2016-04-20 — End: 2016-08-06

## 2016-04-20 MED ORDER — LORAZEPAM 0.5 MG TAB
0.5 mg | ORAL_TABLET | Freq: Three times a day (TID) | ORAL | 0 refills | Status: DC | PRN
Start: 2016-04-20 — End: 2016-08-06

## 2016-04-20 NOTE — Progress Notes (Signed)
Diana Salazar presents today at the clinic for      Chief Complaint   Patient presents with   ??? Establish Care   ??? Knee Pain     Right Knee         Wt Readings from Last 3 Encounters:   04/20/16 265 lb (120.2 kg)   03/13/13 237 lb 4 oz (107.6 kg)     Temp Readings from Last 3 Encounters:   04/20/16 98.1 ??F (36.7 ??C) (Oral)   03/14/13 98 ??F (36.7 ??C)     BP Readings from Last 3 Encounters:   04/20/16 110/74   03/14/13 100/70     Pulse Readings from Last 3 Encounters:   04/20/16 78   03/14/13 77       There are no preventive care reminders to display for this patient.

## 2016-04-20 NOTE — Progress Notes (Signed)
HISTORY OF PRESENT ILLNESS  Diana Salazar is a 40 y.o. female.  HPI   Pt is here to establish care. She was previously on anxiety meds and has not had anything in a few months. Pt was on celexa and thought she would be ok off it but is becoming very anxious. Denies depression and thoughts of harming herself or anyone else. She is concerned about her sleep and states she snores a lot and is very tired during the day. She also fell off a curb 3 weeks ago and twisted her right knee then 2 weeks ago tripped over her daughters toy and twisted the same knee again. She is having pain when walking and esp going up stairs. Denies radiating pain, numbness/tingling, and edema. Lastly pt has a hx of vulvar cancer and was regularly seeing oncology but has not seen an oncologist in 2 years.     Allergies   Allergen Reactions   ??? Sulfa (Sulfonamide Antibiotics) Nausea and Vomiting       Current Outpatient Prescriptions   Medication Sig   ??? escitalopram oxalate (LEXAPRO) 10 mg tablet Take 1 Tab by mouth daily.   ??? LORazepam (ATIVAN) 0.5 mg tablet Take 1 Tab by mouth every eight (8) hours as needed for Anxiety. Max Daily Amount: 1.5 mg.   ??? mupirocin (BACTROBAN) 2 % ointment Apply  to affected area daily.   ??? cephALEXin (KEFLEX) 500 mg capsule Take 1 Cap by mouth four (4) times daily for 5 days.     No current facility-administered medications for this visit.        Review of Systems   Constitutional: Negative.  Negative for chills, fever and malaise/fatigue.   HENT: Negative.  Negative for congestion, ear pain, sore throat and tinnitus.    Eyes: Negative.  Negative for blurred vision, double vision and photophobia.   Respiratory: Negative.  Negative for cough, shortness of breath and wheezing.    Cardiovascular: Negative.  Negative for chest pain, palpitations and leg swelling.   Gastrointestinal: Negative.  Negative for abdominal pain, heartburn, nausea and vomiting.    Genitourinary: Negative.  Negative for dysuria, frequency, hematuria and urgency.   Musculoskeletal: Positive for falls and joint pain (right knee). Negative for back pain, myalgias and neck pain.   Skin: Negative for itching and rash.        Infected lesion   Neurological: Negative.  Negative for dizziness, tingling, tremors and headaches.   Psychiatric/Behavioral: Negative for depression and memory loss. The patient is nervous/anxious. The patient does not have insomnia.      Visit Vitals   ??? BP 110/74 (BP 1 Location: Left arm, BP Patient Position: Sitting)   ??? Pulse 78   ??? Temp 98.1 ??F (36.7 ??C) (Oral)   ??? Resp 18   ??? Ht 5\' 4"  (1.626 m)   ??? Wt 265 lb (120.2 kg)   ??? SpO2 98%   ??? BMI 45.49 kg/m2       Physical Exam   Constitutional: She is oriented to person, place, and time. She appears well-developed and well-nourished. No distress.   HENT:   Head: Normocephalic and atraumatic.   Eyes: Conjunctivae are normal. Pupils are equal, round, and reactive to light.   Cardiovascular: Normal rate, regular rhythm and normal heart sounds.    Pulmonary/Chest: Effort normal and breath sounds normal.   Musculoskeletal:        Right knee: Tenderness found.   Neurological: She is alert and oriented to person, place, and  time.   Skin: Skin is warm and dry. Rash noted. Rash is pustular (sm lesion on left side of waistline). She is not diaphoretic.   Psychiatric: Her speech is normal and behavior is normal. Thought content normal. Her mood appears anxious. Thought content is not paranoid. She does not exhibit a depressed mood. She expresses no homicidal and no suicidal ideation.       ASSESSMENT and PLAN    ICD-10-CM ICD-9-CM    1. Acute pain of right knee M25.561 719.46 XR KNEE RT MAX 2 VWS      MRI KNEE RT WO CONT   2. Fall (on)(from) sidewalk curb, initial encounter W10.1XXA E880.1 XR KNEE RT MAX 2 VWS      MRI KNEE RT WO CONT   3. Anxiety F41.9 300.00 escitalopram oxalate (LEXAPRO) 10 mg tablet       LORazepam (ATIVAN) 0.5 mg tablet   4. Abscess L02.91 682.9 mupirocin (BACTROBAN) 2 % ointment      cephALEXin (KEFLEX) 500 mg capsule   5. Encounter for follow-up surveillance of vulvar cancer Z08 V67.59 REFERRAL TO ONCOLOGY    Z85.44 V10.44    6. Snoring R06.83 786.09 SLEEP MEDICINE REFERRAL   7. Excessive daytime sleepiness G47.19 780.54 SLEEP MEDICINE REFERRAL     Follow-up Disposition:  Return in about 4 weeks (around 05/18/2016) for anxiety.     Pt expressed understanding of visit summary and plans for any follow ups or referrals as well as any medications prescribed.

## 2016-04-20 NOTE — Patient Instructions (Signed)
Knee Sprain: Care Instructions  Your Care Instructions    A knee sprain is one or more stretched, partly torn, or completely torn knee ligaments. Ligaments are bands of ropelike tissue that connect bone to bone and make the knee stable. The knee has four main ligaments.  Knee sprains often happen because of a twisting or bending injury from sports such as skiing, basketball, soccer, or football. The knee turns one way while the lower or upper leg goes another way. A sprain also can happen when the knee is hit from the side or the front.  If a knee ligament is slightly stretched, you will probably need only home treatment. You may need a splint or brace (immobilizer) for a partly torn ligament. A complete tear may need surgery. A minor knee sprain may take up to 6 weeks to heal, while a severe sprain may take months.  Follow-up care is a key part of your treatment and safety. Be sure to make and go to all appointments, and call your doctor if you are having problems. It's also a good idea to know your test results and keep a list of the medicines you take.  How can you care for yourself at home?  ?? Follow instructions about how much weight you can put on your leg and how to walk with crutches.  ?? Prop up your leg on a pillow when you ice it or anytime you sit or lie down for the next 3 days. Try to keep it above the level of your heart. This will help reduce swelling.  ?? Put ice or a cold pack on your knee for 10 to 20 minutes at a time. Try to do this every 1 to 2 hours for the next 3 days (when you are awake) or until the swelling goes down. Put a thin cloth between the ice and your skin. Do not get the splint wet.  ?? If you have an elastic bandage, make sure it is snug but not so tight that your leg is numb, tingles, or swells below the bandage. You can loosen the bandage if it is too tight.  ?? Your doctor may recommend a brace (immobilizer) to support your knee while it heals. Wear it as directed.   ?? Ask your doctor if you can take an over-the-counter pain medicine, such as acetaminophen (Tylenol), ibuprofen (Advil, Motrin), or naproxen (Aleve). Be safe with medicines. Read and follow all instructions on the label.  When should you call for help?  Call 911 anytime you think you may need emergency care. For example, call if:  ?? You have sudden chest pain and shortness of breath, or you cough up blood.  Call your doctor now or seek immediate medical care if:  ?? You have increased or severe pain.  ?? You cannot move your toes or ankle.  ?? Your foot is cool or pale or changes color.  ?? You have tingling, weakness, or numbness in your foot or leg.  ?? Your splint or brace feels too tight.  ?? You are unable to straighten the knee, or the knee "locks."  ?? You have signs of a blood clot in your leg, such as:  ?? Pain in your calf, back of the knee, thigh, or groin.  ?? Redness and swelling in your leg.  Watch closely for changes in your health, and be sure to contact your doctor if:  ?? Your pain is not getting better or is getting worse.  Where   can you learn more?  Go to http://www.healthwise.net/[employer].  Enter N406 in the search box to learn more about "Knee Sprain: Care Instructions."  Current as of: May 19, 2015  Content Version: 11.2  ?? 2006-2017 [employer]. Care instructions adapted under license by [employer] (which disclaims liability or warranty for this information). If you have questions about a medical condition or this instruction, always ask your healthcare professional. [employer] disclaims any warranty or liability for your use of this information.

## 2016-04-27 ENCOUNTER — Encounter

## 2016-04-29 ENCOUNTER — Ambulatory Visit: Payer: MEDICAID | Primary: Family Medicine

## 2016-04-30 ENCOUNTER — Telehealth

## 2016-04-30 NOTE — Telephone Encounter (Signed)
[facility] calling in stating that Pt.'s insurance has denied her MRI

## 2016-05-04 ENCOUNTER — Encounter: Attending: Physician Assistant | Primary: Family Medicine

## 2016-05-05 NOTE — Addendum Note (Signed)
Addended by: Nickolas Madrid B on: 05/05/2016 01:12 PM      Modules accepted: Orders

## 2016-05-05 NOTE — Telephone Encounter (Signed)
Please advise pt her insurance denied her MRI so I am going to refer her to ortho.

## 2016-05-05 NOTE — Telephone Encounter (Signed)
Called to inform Pt there was no answer I left a message

## 2016-08-05 ENCOUNTER — Ambulatory Visit
Admit: 2016-08-05 | Discharge: 2016-08-05 | Payer: PRIVATE HEALTH INSURANCE | Attending: Physician Assistant | Primary: Family Medicine

## 2016-08-05 DIAGNOSIS — J029 Acute pharyngitis, unspecified: Secondary | ICD-10-CM

## 2016-08-05 LAB — AMB POC RAPID STREP A: Group A Strep Ag: NEGATIVE

## 2016-08-05 MED ORDER — AZITHROMYCIN 500 MG TAB
500 mg | ORAL_TABLET | Freq: Every day | ORAL | 0 refills | Status: AC
Start: 2016-08-05 — End: 2016-08-15

## 2016-08-05 NOTE — Progress Notes (Signed)
HISTORY OF PRESENT ILLNESS  Diana Salazar is a 40 y.o. female.  HPI   Pt c/o ST and fever x 2 days assoc with malaise and congestion. Denies cough, HA, rash, CP, SOB, otalgia, dizziness, myalgias, and N/V/D.     Allergies   Allergen Reactions   ??? Sulfa (Sulfonamide Antibiotics) Nausea and Vomiting       Current Outpatient Prescriptions   Medication Sig   ??? QUEtiapine (SEROQUEL) 200 mg tablet TAKE 1 TABLET BY MOUTH AT BEDTIME   ??? OXcarbazepine (TRILEPTAL) 300 mg tablet TAKE 1 TABLET BY MOUTH AT BEDTIME   ??? azithromycin (ZITHROMAX) 500 mg tab Take 1 Tab by mouth daily for 10 days.     No current facility-administered medications for this visit.        Review of Systems   Constitutional: Positive for fever and malaise/fatigue. Negative for chills.   HENT: Positive for congestion and sore throat. Negative for ear pain and tinnitus.    Eyes: Negative.  Negative for blurred vision, double vision and photophobia.   Respiratory: Negative.  Negative for cough, shortness of breath and wheezing.    Cardiovascular: Negative.  Negative for chest pain, palpitations and leg swelling.   Gastrointestinal: Negative.  Negative for abdominal pain, heartburn, nausea and vomiting.   Genitourinary: Negative.  Negative for dysuria, frequency, hematuria and urgency.   Musculoskeletal: Negative.  Negative for back pain, joint pain, myalgias and neck pain.   Skin: Negative.  Negative for itching and rash.   Neurological: Negative.  Negative for dizziness, tingling, tremors and headaches.   Psychiatric/Behavioral: Negative.  Negative for depression and memory loss. The patient is not nervous/anxious and does not have insomnia.      Visit Vitals   ??? BP 115/78 (BP 1 Location: Left arm, BP Patient Position: Sitting)   ??? Pulse 94   ??? Temp 99.1 ??F (37.3 ??C) (Oral)   ??? Resp 18   ??? Ht 5\' 4"  (1.626 m)   ??? Wt 260 lb (117.9 kg)   ??? SpO2 98%   ??? BMI 44.63 kg/m2       Physical Exam    Constitutional: She is oriented to person, place, and time. She appears well-developed and well-nourished. No distress.   HENT:   Head: Normocephalic and atraumatic.   Right Ear: Tympanic membrane and ear canal normal.   Left Ear: Tympanic membrane, external ear and ear canal normal.   Nose: Mucosal edema and rhinorrhea present. Right sinus exhibits frontal sinus tenderness. Right sinus exhibits no maxillary sinus tenderness. Left sinus exhibits frontal sinus tenderness. Left sinus exhibits no maxillary sinus tenderness.   Mouth/Throat: Uvula is midline and mucous membranes are normal. Posterior oropharyngeal erythema present. No oropharyngeal exudate or posterior oropharyngeal edema.   Cardiovascular: Normal rate, regular rhythm and normal heart sounds.  Exam reveals no gallop and no friction rub.    No murmur heard.  Pulmonary/Chest: Effort normal and breath sounds normal. No respiratory distress. She has no wheezes. She has no rales.   Neurological: She is alert and oriented to person, place, and time.   Skin: Skin is warm and dry. She is not diaphoretic.   Psychiatric: She has a normal mood and affect. Her behavior is normal.       ASSESSMENT and PLAN    ICD-10-CM ICD-9-CM    1. Sore throat J02.9 462 QUEtiapine (SEROQUEL) 200 mg tablet      OXcarbazepine (TRILEPTAL) 300 mg tablet      AMB POC RAPID STREP  A      azithromycin (ZITHROMAX) 500 mg tab      DISCONTINUED: benztropine (COGENTIN) 1 mg tablet   2. Acute non-recurrent frontal sinusitis J01.10 461.1 azithromycin (ZITHROMAX) 500 mg tab     Follow-up Disposition:  Return if symptoms worsen or fail to improve.   Pt expressed understanding of visit summary and plans for any follow ups or referrals as well as any medications prescribed.

## 2016-08-05 NOTE — Patient Instructions (Signed)
Sore Throat: Care Instructions  Your Care Instructions    Infection by bacteria or a virus causes most sore throats. Cigarette smoke, dry air, air pollution, allergies, and yelling can also cause a sore throat. Sore throats can be painful and annoying. Fortunately, most sore throats go away on their own. If you have a bacterial infection, your doctor may prescribe antibiotics.  Follow-up care is a key part of your treatment and safety. Be sure to make and go to all appointments, and call your doctor if you are having problems. It's also a good idea to know your test results and keep a list of the medicines you take.  How can you care for yourself at home?  ?? If your doctor prescribed antibiotics, take them as directed. Do not stop taking them just because you feel better. You need to take the full course of antibiotics.  ?? Gargle with warm salt water once an hour to help reduce swelling and relieve discomfort. Use 1 teaspoon of salt mixed in 1 cup of warm water.  ?? Take an over-the-counter pain medicine, such as acetaminophen (Tylenol), ibuprofen (Advil, Motrin), or naproxen (Aleve). Read and follow all instructions on the label.  ?? Be careful when taking over-the-counter cold or flu medicines and Tylenol at the same time. Many of these medicines have acetaminophen, which is Tylenol. Read the labels to make sure that you are not taking more than the recommended dose. Too much acetaminophen (Tylenol) can be harmful.  ?? Drink plenty of fluids. Fluids may help soothe an irritated throat. Hot fluids, such as tea or soup, may help decrease throat pain.  ?? Use over-the-counter throat lozenges to soothe pain. Regular cough drops or hard candy may also help. These should not be given to young children because of the risk of choking.  ?? Do not smoke or allow others to smoke around you. If you need help quitting, talk to your doctor about stop-smoking programs and medicines.  These can increase your chances of quitting for good.  ?? Use a vaporizer or humidifier to add moisture to your bedroom. Follow the directions for cleaning the machine.  When should you call for help?  Call your doctor now or seek immediate medical care if:  ?? You have new or worse trouble swallowing.  ?? Your sore throat gets much worse on one side.  Watch closely for changes in your health, and be sure to contact your doctor if you do not get better as expected.  Where can you learn more?  Go to http://www.healthwise.net/[employer].  Enter U420 in the search box to learn more about "Sore Throat: Care Instructions."  Current as of: July 25, 2015  Content Version: 11.3  ?? 2006-2017 [employer]. Care instructions adapted under license by [employer] (which disclaims liability or warranty for this information). If you have questions about a medical condition or this instruction, always ask your healthcare professional. [employer] disclaims any warranty or liability for your use of this information.

## 2016-08-05 NOTE — Progress Notes (Signed)
Chief Complaint   Patient presents with   ??? Sore Throat   ??? Fever     Pt states this started about 3 days ago    1. Have you been to the ER, urgent care clinic since your last visit?  Hospitalized since your last visit?No    2. Have you seen or consulted any other health care providers outside of the [place] since your last visit?  Include any pap smears or colon screening. No

## 2016-08-10 NOTE — Progress Notes (Signed)
Prior auth for zmax approved

## 2016-08-23 ENCOUNTER — Ambulatory Visit
Admit: 2016-08-23 | Discharge: 2016-08-23 | Payer: PRIVATE HEALTH INSURANCE | Attending: Internal Medicine | Primary: Family Medicine

## 2016-08-23 ENCOUNTER — Encounter: Attending: Internal Medicine | Primary: Family Medicine

## 2016-08-23 DIAGNOSIS — Z202 Contact with and (suspected) exposure to infections with a predominantly sexual mode of transmission: Secondary | ICD-10-CM

## 2016-08-23 LAB — AMB POC URINALYSIS DIP STICK AUTO W/O MICRO
Glucose (UA POC): NEGATIVE
Ketones (UA POC): NEGATIVE
Nitrites (UA POC): NEGATIVE
Specific gravity (UA POC): 1.025 (ref 1.001–1.035)
Urobilinogen (UA POC): 0.2 (ref 0.2–1)
pH (UA POC): 6 (ref 4.6–8.0)

## 2016-08-23 LAB — AMB POC URINE PREGNANCY TEST, VISUAL COLOR COMPARISON: HCG urine, Ql. (POC): NEGATIVE

## 2016-08-23 MED ORDER — PHENAZOPYRIDINE 100 MG TAB
100 mg | ORAL_TABLET | Freq: Three times a day (TID) | ORAL | 0 refills | Status: AC
Start: 2016-08-23 — End: 2016-08-26

## 2016-08-23 MED ORDER — CEFTRIAXONE 250 MG SOLUTION FOR INJECTION
250 mg | Freq: Once | INTRAMUSCULAR | 0 refills | Status: AC
Start: 2016-08-23 — End: 2016-08-23

## 2016-08-23 MED ORDER — AZITHROMYCIN 500 MG TAB
500 mg | ORAL_TABLET | Freq: Every day | ORAL | 0 refills | Status: AC
Start: 2016-08-23 — End: 2016-08-24

## 2016-08-23 NOTE — Progress Notes (Addendum)
Subjective:   Diana Salazar is a 40 y.o.  female who presents for pain with urination and vaginal discharge.    Pt reports a 5 day history of dysuria, suprapubic discomfort, and white/yellow vaginal discharge. History is significant for endometriosis, chronic pelvic pain, ectopic pregnancy x2, previous STD, bilateral salpingectomy, and vulvovaginal cancer.  She denies vaginal bleeding or itching.  Pt reports recenlty breaking up with her boyfriend and engaging in unprotected sex with a new female partner 2 weeks ago.  Reports intercourse as painful.  She suspects STD exposure.  She denies fever/chills, n/v.       Review of Systems   Constitutional: Negative for chills and fever.   Gastrointestinal: Negative for abdominal pain, nausea and vomiting.   Genitourinary: Positive for dysuria. Negative for flank pain, frequency, hematuria and urgency.   Skin: Negative.        Current Outpatient Prescriptions on File Prior to Visit   Medication Sig Dispense Refill   ??? QUEtiapine (SEROQUEL) 200 mg tablet TAKE 1 TABLET BY MOUTH AT BEDTIME  0   ??? OXcarbazepine (TRILEPTAL) 300 mg tablet TAKE 1 TABLET BY MOUTH AT BEDTIME  0     No current facility-administered medications on file prior to visit.        Reviewed PmHx, RxHx, FmHx, SocHx, AllgHx and updated and dated in the chart.    Nurse notes were reviewed and are correct    Objective:     Vitals:    08/23/16 1456   BP: 112/76   Pulse: 76   Resp: 18   Temp: 98.1 ??F (36.7 ??C)   TempSrc: Oral   SpO2: 98%   Weight: 258 lb (117 kg)   Height: 5\' 4"  (1.626 m)     Physical Exam   Genitourinary: Uterus normal. Pelvic exam was performed with patient supine. Cervix exhibits discharge. Cervix exhibits no motion tenderness and no friability. Right adnexum displays no mass, no tenderness and no fullness. Left adnexum displays no mass, no tenderness and no fullness. There is tenderness (mild) in the vagina. No erythema or bleeding in the  vagina. No foreign body in the vagina. No signs of injury around the vagina. Vaginal discharge found.   Genitourinary Comments: Chaperone present during exam   Nursing note and vitals reviewed.      Assessment/ Plan:     Diagnoses and all orders for this visit:    1. Exposure to STD  -     cefTRIAXone (ROCEPHIN) 250 mg injection; 250 mg by IntraMUSCular route once for 1 dose.  -     azithromycin (ZITHROMAX) 500 mg tab; Take 2 Tabs by mouth daily for 1 day.  -     PR THER/PROPH/DIAG INJECTION, SUBCUT/IM        -     AMB POC URINE PREGNANCY TEST, VISUAL COLOR COMPARISON - negative  -     Counseled on safe sex practices, advised to abstain from sexual activity for 1 week following completion of treatment.  Advised to have sexual partners go for testing.  -     Will contact patient for results of testing.    2. Vaginal discharge  -     NUSWAB VG PLUS+MYCOPLASMAS,NAA; Future    3. Dysuria  -     AMB POC URINALYSIS DIP STICK AUTO W/O MICRO  -     phenazopyridine (PYRIDIUM) 100 mg tablet; Take 1 Tab by mouth three (3) times daily (after meals) for 3 days.    4. Leukocytes  in urine  -     CULTURE, URINE; Future    5. Pelvic pain        -     H/o endometriosis and chronic pelvic pain        -     Pt will follow up with OB/Gyn for further evaluation      6. History of endometriosis        -     Pt will follow up with OB/Gyn for further evaluation    7. Encounter for follow-up surveillance of vulvar cancer        -     H/o vulvar cancer removal.  Pt is overdue for follow-up        -     Pt will follow up with oncology for further evaluation       I have discussed the diagnosis with the patient and the intended plan as seen in the above orders.  The patient verbalized understanding and agrees with the plan.    Follow-up Disposition: Not on Moosic III, MD

## 2016-08-26 NOTE — Telephone Encounter (Signed)
Ms Parnes called requesting her recent lab results.  Please contact her cell # [phone] to discuss.

## 2016-08-26 NOTE — Telephone Encounter (Signed)
Called [facility] to ask why the NUSWAB was canceled they stated  You can't use Urine for those test it must me a Vaginal Swab.

## 2016-09-17 LAB — CULTURE, URINE

## 2016-09-17 LAB — NUSWAB VG PLUS+MYCOPLASMAS,NAA
C. trachomatis, NAA: NEGATIVE
N. gonorrhoeae, NAA: NEGATIVE
T. vaginalis, NAA: NEGATIVE

## 2017-05-14 ENCOUNTER — Inpatient Hospital Stay
Admit: 2017-05-14 | Discharge: 2017-05-15 | Disposition: A | Discharge status: Home / Self Care | Payer: MEDICAID | Attending: Internal Medicine

## 2017-05-14 DIAGNOSIS — T192XXA Foreign body in vulva and vagina, initial encounter: Secondary | ICD-10-CM

## 2017-05-14 LAB — URINALYSIS W/ RFLX MICROSCOPIC
Bilirubin: NEGATIVE
Glucose: NEGATIVE mg/dL
Ketone: NEGATIVE mg/dL
Nitrites: NEGATIVE
Protein: NEGATIVE mg/dL
Specific gravity: 1.019 (ref 1.005–1.030)
Urobilinogen: 1 EU/dL (ref 0.2–1.0)
pH (UA): 7.5 (ref 5.0–8.0)

## 2017-05-14 LAB — URINE MICROSCOPIC ONLY
RBC: 0 /hpf (ref 0–5)
WBC: 0 /hpf (ref 0–5)

## 2017-05-14 LAB — HCG URINE, QL. - POC: Pregnancy test,urine (POC): NEGATIVE

## 2017-05-14 MED ORDER — CEPHALEXIN 500 MG CAP
500 mg | ORAL_CAPSULE | Freq: Two times a day (BID) | ORAL | 0 refills | Status: AC
Start: 2017-05-14 — End: 2017-05-21

## 2017-05-14 NOTE — ED Triage Notes (Signed)
Patient states that she has a tampon stuck in her vagina for about 4 days. Patient states that she has an odor and abdominal cramping. Sepsis Screening completed    (  )Patient meets SIRS criteria.  ( x )Patient does not meet SIRS criteria.      SIRS Criteria is achieved when two or more of the following are present  ? Temperature < 96.8??F (36??C) or > 100.9??F (38.3??C)  ? Heart Rate > 90 beats per minute  ? Respiratory Rate > 20 breaths per minute  ? WBC count > 12,000 or <4,000 or > 10% bands

## 2017-05-14 NOTE — ED Provider Notes (Signed)
EMERGENCY DEPARTMENT HISTORY AND PHYSICAL EXAM    6:32 PM      Date: 05/14/2017  Patient Name: Diana Salazar    History of Presenting Illness     Chief Complaint   Patient presents with   ??? Foreign Body in Vagina         History Provided By: Patient    Chief Complaint: foreign body  Duration: 4 Days  Timing:  Worsening  Location: vagina  Modifying Factors: Attempted to remove foreign body without success   Associated Symptoms: vaginal odor, abdominal pain, urinary frequency, and dysuria    6:48 PM   Additional History (Context): Kamirah Whitelaw is a 41 y.o. female Pmhx of partial hysterectomy who presents with foreign body in vagina onset 4 days ago. Reports four days ago she had painful sexual intercourse and realized her tampon was still in. Attempted to remove tampon without success. Associated symptoms include vaginal pain, vaginal odor, abdominal pain, urinary frequency, and dysuria. Pt is concerned for a bladder infection. Pt is not concerned for pregnancy. Finished menstrual cycle one day ago. Pt denies fever, chills, vaginal bleeding, and any other Sx or complaints.    PCP: Audley Hose, MD      Past History     Past Medical History:  Past Medical History:   Diagnosis Date   ??? Back pain    ??? Other ill-defined conditions(799.89)    ??? Vulvar cancer [facility])        Past Surgical History:  Past Surgical History:   Procedure Laterality Date   ??? HX CESAREAN SECTION     ??? HX PARTIAL HYSTERECTOMY         Family History:  Family History   Problem Relation Age of Onset   ??? No Known Problems Mother    ??? No Known Problems Father        Social History:  Social History   Substance Use Topics   ??? Smoking status: Never Smoker   ??? Smokeless tobacco: Never Used   ??? Alcohol use Yes       Allergies:  Allergies   Allergen Reactions   ??? Sulfa (Sulfonamide Antibiotics) Nausea and Vomiting         Review of Systems     Review of Systems   Constitutional: Negative for chills and fever.    Genitourinary: Positive for dysuria, frequency and vaginal pain. Negative for vaginal bleeding.        (+) foreign body in vagina  (+) vaginal odor   All other systems reviewed and are negative.       Physical Exam     Visit Vitals   ??? BP 142/81 (BP 1 Location: Left arm, BP Patient Position: At rest)   ??? Pulse 71   ??? Temp 98.2 ??F (36.8 ??C)   ??? Resp 20   ??? Ht 5\' 4"  (1.626 m)   ??? Wt 114.8 kg (253 lb)   ??? LMP 05/07/2017   ??? SpO2 100%   ??? BMI 43.43 kg/m2       Physical Exam   Constitutional: She appears well-developed and well-nourished. No distress.   HENT:   Head: Normocephalic and atraumatic.   Right Ear: External ear normal.   Left Ear: External ear normal.   Nose: Nose normal.   Eyes: Conjunctivae are normal.   Neck: Normal range of motion.   Cardiovascular: Normal rate.    Pulmonary/Chest: Effort normal. No respiratory distress.   Abdominal: Soft. Bowel sounds are  normal. There is tenderness. There is no rebound and no guarding.   Mild suprapubic tenderness to palpation.   Neurological: She is alert.   Skin: Skin is warm and dry. She is not diaphoretic.   Psychiatric: She has a normal mood and affect.   Vitals reviewed.      Diagnostic Study Results     Labs -  Recent Results (from the past 12 hour(s))   URINALYSIS W/ RFLX MICROSCOPIC    Collection Time: 05/14/17  6:45 PM   Result Value Ref Range    Color YELLOW      Appearance CLEAR      Specific gravity 1.019 1.005 - 1.030      pH (UA) 7.5 5.0 - 8.0      Protein NEGATIVE  NEG mg/dL    Glucose NEGATIVE  NEG mg/dL    Ketone NEGATIVE  NEG mg/dL    Bilirubin NEGATIVE  NEG      Blood SMALL (A) NEG      Urobilinogen 1.0 0.2 - 1.0 EU/dL    Nitrites NEGATIVE  NEG      Leukocyte Esterase TRACE (A) NEG     URINE MICROSCOPIC ONLY    Collection Time: 05/14/17  6:45 PM   Result Value Ref Range    WBC 0 to 3 0 - 5 /hpf    RBC 0 to 3 0 - 5 /hpf    Epithelial cells 1+ 0 - 5 /lpf    Bacteria 1+ (A) NEG /hpf    Mucus FEW (A) NEG /lpf   HCG URINE, QL. - POC     Collection Time: 05/14/17  6:58 PM   Result Value Ref Range    Pregnancy test,urine (POC) NEGATIVE  NEG     WET PREP    Collection Time: 05/14/17  7:30 PM   Result Value Ref Range    Special Requests: NO SPECIAL REQUESTS      Wet prep NO YEAST,TRICHOMONAS OR CLUE CELLS NOTED         Radiologic Studies -   No orders to display     CT Results  (Last 48 hours)    None        CXR Results  (Last 48 hours)    None          Medical Decision Making   I am the first provider for this patient.    I reviewed the vital signs, available nursing notes, past medical history, past surgical history, family history and social history.    Vital Signs- Reviewed the patient's vital signs.    Records Reviewed: Nursing Notes (Time of Review: 6:32 PM)    Provider Notes (Medical Decision Making): Pt with retained tampon removed on exam. Minimal bleeding after removal. Will place on cephalexin for prophylaxis as well as urinary sx.     7:20 PM After tampon removal, pt requested to be swabbed for gonorrhea and chlamydia.      Procedures: Foreign Body Removal  Date/Time: 05/14/2017 7:15 PM  Performed by: Tanda Rockers D  Authorized by: Sheralyn Boatman I     Consent:     Consent obtained:  Verbal    Consent given by:  Patient    Risks discussed:  Pain, bleeding and infection    Alternatives discussed:  No treatment  Location:     Location:  Anogenital    Anogenital location:  Vulva  Pre-procedure details:     Imaging:  None  Anesthesia (see MAR for exact  dosages):     Anesthesia method:  None  Procedure details:     Foreign bodies recovered:  1    Description:  Tampon     Intact foreign body removal: yes    Post-procedure details:     Patient tolerance of procedure:  Tolerated well, no immediate complications  Comments:      Intact tampon removed without immediate complications   Pelvic Exam  Date/Time: 05/14/2017 7:22 PM  Performed by: PA  Procedure duration:  5 minutes.  Documented by:  Delaney Meigs, Scribe.    As dictated by:  Tanda Rockers, PA  Exam assisted by:  Delaney Meigs, Scribe.  Type of exam performed: bimanual and speculum.    External genitalia appearance: normal.    Vaginal exam:  bleeding.    Post-procedure bleeding: scant, after foreign body removal.  Cervical exam:  normal, no cervical motion tenderness and os closed.    Specimen(s) collected:  chlamydia, GC and vaginal culture.  Bimanual exam:  normal.    Patient tolerance: Patient tolerated the procedure well with no immediate complications          Diagnosis     Clinical Impression:   1. Foreign body of vagina, initial encounter    2. Retained tampon, initial encounter    3. Acute cystitis without hematuria        Disposition: Discharge     Follow-up Information     Follow up With Details Comments Osgood III, MD Schedule an appointment as soon as possible for a visit For primary care follow up [place] [id number]  [phone]      [facility] EMERGENCY DEPT Go to As needed, If symptoms worsen [address]  [place] [address]  [phone]           Patient's Medications   Start Taking    CEPHALEXIN (KEFLEX) 500 MG CAPSULE    Take 1 Cap by mouth two (2) times a day for 7 days.   Continue Taking    No medications on file   These Medications have changed    No medications on file   Stop Taking    OXCARBAZEPINE (TRILEPTAL) 300 MG TABLET    TAKE 1 TABLET BY MOUTH AT BEDTIME    QUETIAPINE (SEROQUEL) 200 MG TABLET    TAKE 1 TABLET BY MOUTH AT BEDTIME        Attestation  This note is prepared by Delaney Meigs, acting as Scribe for Ralph Leyden, PA-C.    Ralph Leyden, PA-C: The scribe's documentation has been prepared under my direction and personally reviewed by me in its entirety. I confirm that the note above accurately reflects all work, treatment, procedures, and medical decision making performed by me.

## 2017-05-14 NOTE — ED Notes (Signed)
Patient armband removed and shredded  I have reviewed discharge instructions with the patient.  The patient verbalized understanding.

## 2017-05-15 LAB — WET PREP

## 2017-05-17 LAB — CHLAMYDIA/NEISSERIA AMPLIFICATION
Chlamydia amplification: NEGATIVE
N. gonorrhoeae amplification: NEGATIVE

## 2017-09-12 ENCOUNTER — Encounter: Payer: Self-pay | Admitting: Emergency Medicine

## 2017-09-12 ENCOUNTER — Emergency Department
Admission: EM | Admit: 2017-09-12 | Discharge: 2017-09-12 | Disposition: A | Discharge status: Home / Self Care | Payer: Medicaid Other | Attending: Emergency Medicine | Admitting: Emergency Medicine

## 2017-09-12 DIAGNOSIS — F17210 Nicotine dependence, cigarettes, uncomplicated: Secondary | ICD-10-CM | POA: Insufficient documentation

## 2017-09-12 DIAGNOSIS — L03317 Cellulitis of buttock: Secondary | ICD-10-CM | POA: Diagnosis not present

## 2017-09-12 DIAGNOSIS — R222 Localized swelling, mass and lump, trunk: Secondary | ICD-10-CM | POA: Diagnosis present

## 2017-09-12 HISTORY — DX: Unspecified ectopic pregnancy without intrauterine pregnancy: O00.90

## 2017-09-12 HISTORY — DX: Malignant (primary) neoplasm, unspecified: C80.1

## 2017-09-12 MED ORDER — CLINDAMYCIN HCL 150 MG PO CAPS
300.0000 mg | ORAL_CAPSULE | Freq: Once | ORAL | Status: AC
  Administered 2017-09-12: 300 mg via ORAL
  Filled 2017-09-12: qty 2

## 2017-09-12 MED ORDER — CLINDAMYCIN HCL 300 MG PO CAPS: 300.0000 mg | ORAL_CAPSULE | Freq: Three times a day (TID) | ORAL | 0 refills | Status: AC

## 2017-09-12 MED ORDER — HYDROCODONE-ACETAMINOPHEN 5-325 MG PO TABS: 1.0000 | ORAL_TABLET | ORAL | 0 refills | Status: DC | PRN

## 2017-09-12 MED ORDER — DIPHENHYDRAMINE HCL 25 MG PO CAPS
ORAL_CAPSULE | ORAL | Status: AC
  Administered 2017-09-12: 25 mg via ORAL
  Filled 2017-09-12: qty 1

## 2017-09-12 MED ORDER — DIPHENHYDRAMINE HCL 25 MG PO CAPS
25.0000 mg | ORAL_CAPSULE | Freq: Once | ORAL | Status: AC
  Administered 2017-09-12: 25 mg via ORAL
  Filled 2017-09-12: qty 1

## 2017-09-12 MED ORDER — MORPHINE SULFATE (PF) 4 MG/ML IV SOLN
4.0000 mg | Freq: Once | INTRAVENOUS | Status: AC
  Administered 2017-09-12: 4 mg via INTRAMUSCULAR
  Filled 2017-09-12: qty 1

## 2017-09-12 NOTE — ED Notes (Signed)
This rn accompanied md during examination and chaperone.

## 2017-09-12 NOTE — ED Triage Notes (Signed)
Pt presents tonight with abscess to inner left buttock; redness and warmth to area; no drainage from site; pt says she noticed the area about 4 days ago and it is continuing to get bigger; warm soaks and hydrogen peroxide at home are not helping

## 2017-09-12 NOTE — Discharge Instructions (Signed)
please follow-up with a primary care doctor in 2-3 days for recheck/reevaluation. Return to emergency department for any spreading redness, if you develop fever, or any other symptom personally concerning to herself. Please take your pain medication as needed, but only as written. Please take your prescribed anabiotic for its entire course.

## 2017-09-12 NOTE — ED Provider Notes (Signed)
[facility] Emergency Department Provider Note  Time seen: 2:17 AM  I have reviewed the triage vital signs and the nursing notes.   HISTORY  Chief Complaint Abscess    HPI Gabrielle Watts is a 41 y.o. female Presents to the emergency department with left buttock pain. According to the patient 4 days ago she developed itching to her left buttock. She states over the past 4 days she has developed pain that has progressively worsened to this area along with the redness. Patient states the pain as an 8/10 worse with any type of touching. Denies fever, nausea, vomiting or abdominal pain. Denies history of abscess.  Past Medical History:  Diagnosis Date  . Cancer ([place])   . Ectopic pregnancy     There are no active problems to display for this patient.   Past Surgical History:  Procedure Laterality Date  . BILATERAL SALPINGECTOMY    . CESAREAN SECTION    . VULVA SURGERY      Prior to Admission medications   Not on File    Allergies  Allergen Reactions  . Sulfa Antibiotics Hives    History reviewed. No pertinent family history.  Social History Social History  Substance Use Topics  . Smoking status: Current Every Day Smoker    Packs/day: 0.50    Types: Cigarettes  . Smokeless tobacco: Never Used  . Alcohol use Yes     Comment: occasional    Review of Systems Constitutional: Negative for fever. Cardiovascular: Negative for chest pain. Respiratory: Negative for shortness of breath. Gastrointestinal: Negative for abdominal pain. Left buttock pain and swelling. Musculoskeletal: Negative for back pain. Skin: redness pain and swelling to left buttock. All other ROS negative  ____________________________________________   PHYSICAL EXAM:  VITAL SIGNS: ED Triage Vitals [09/12/17 0014]  Enc Vitals Group     BP (!) 157/76     Pulse Rate (!) 102     Resp 17     Temp 98.9 F (37.2 C)     Temp Source Oral     SpO2 98 %     Weight 243  lb (110.2 kg)     Height 5\' 4"  (1.626 m)     Head Circumference      Peak Flow      Pain Score 8     Pain Loc      Pain Edu?      Excl. in [place]?     Constitutional: Alert and oriented. Well appearing and in no distress. Eyes: Normal exam ENT   Head: Normocephalic and atraumatic.   Mouth/Throat: Mucous membranes are moist. Cardiovascular: Normal rate, regular rhythm. No murmur Respiratory: Normal respiratory effort without tachypnea nor retractions. Breath sounds are clear Gastrointestinal: Soft and nontender. No distention.  thrombotic exam the patient has approximately 4 x 2 cm of erythema with induration and significant tenderness to this area. No obvious pointing, or obvious drainable abscess. Area of erythema/induration is at least 4 cm removed from the anus. Musculoskeletal: Nontender with normal range of motion in all extremities.  Neurologic:  Normal speech and language. No gross focal neurologic deficits Skin:  Skin is warm, dry, erythema with induration and tenderness as described above the left buttock. Psychiatric: Mood and affect are normal.   ____________________________________________   INITIAL IMPRESSION / ASSESSMENT AND PLAN / ED COURSE  Pertinent labs & imaging results that were available during my care of the patient were reviewed by me and considered in my medical decision making (see  chart for details).  patient presents with cellulitis to the left buttock approximate 4 x 2 cm area. No fluctuance, no pointing. Bedside ultrasound performed by myself shows induration, but no drainable abscess. Most consistent with cellulitis. Patient has a sulfa allergy we will place on clindamycin 3 times daily. Patient does have significant tenderness to this area we will discharge on pain medication and have her follow-up with her doctor in 2-3 days for recheck. I discussed return precautions for any worsening/spreading of the infected area or fever. Patient agreeable to  plan.  ____________________________________________   FINAL CLINICAL IMPRESSION(S) / ED DIAGNOSES  cellulitis    Harvest Dark, MD 09/12/17 [phone]

## 2017-12-02 ENCOUNTER — Emergency Department
Admission: EM | Admit: 2017-12-02 | Discharge: 2017-12-02 | Disposition: A | Discharge status: Home / Self Care | Payer: Medicaid Other | Attending: Emergency Medicine | Admitting: Emergency Medicine

## 2017-12-02 ENCOUNTER — Other Ambulatory Visit: Payer: Self-pay

## 2017-12-02 ENCOUNTER — Encounter: Payer: Self-pay | Admitting: Emergency Medicine

## 2017-12-02 DIAGNOSIS — Z859 Personal history of malignant neoplasm, unspecified: Secondary | ICD-10-CM | POA: Insufficient documentation

## 2017-12-02 DIAGNOSIS — J029 Acute pharyngitis, unspecified: Secondary | ICD-10-CM

## 2017-12-02 DIAGNOSIS — F17210 Nicotine dependence, cigarettes, uncomplicated: Secondary | ICD-10-CM | POA: Insufficient documentation

## 2017-12-02 MED ORDER — AMOXICILLIN 500 MG PO TABS: 500.0000 mg | ORAL_TABLET | Freq: Three times a day (TID) | ORAL | 0 refills | Status: DC

## 2017-12-02 NOTE — ED Provider Notes (Signed)
[facility] Emergency Department Provider Note  ____________________________________________  Time seen: Approximately 10:02 AM  I have reviewed the triage vital signs and the nursing notes.   HISTORY  Chief Complaint Sore Throat    HPI Gabrielle Watts is a 41 y.o. female who presents to the emergency department for evaluation and treatment of sore throat that started yesterday. She also states that she has had a headache.  She denies cough or fever.  She has not taken any medications to help alleviate her symptoms.  Past Medical History:  Diagnosis Date  . Cancer ([place])   . Ectopic pregnancy     There are no active problems to display for this patient.   Past Surgical History:  Procedure Laterality Date  . BILATERAL SALPINGECTOMY    . CESAREAN SECTION    . VULVA SURGERY      Prior to Admission medications   Medication Sig Start Date End Date Taking? Authorizing Provider  amoxicillin (AMOXIL) 500 MG tablet Take 1 tablet (500 mg total) by mouth 3 (three) times daily. 12/02/17   Cattleya Dobratz, Johnette Abraham B, FNP  HYDROcodone-acetaminophen (NORCO/VICODIN) 5-325 MG tablet Take 1 tablet by mouth every 4 (four) hours as needed. 09/12/17   Harvest Dark, MD    Allergies Sulfa antibiotics  No family history on file.  Social History Social History   Tobacco Use  . Smoking status: Current Every Day Smoker    Packs/day: 0.50    Types: Cigarettes  . Smokeless tobacco: Never Used  Substance Use Topics  . Alcohol use: Yes    Comment: occasional  . Drug use: No    Review of Systems Constitutional: Negative for fever. Eyes: No visual changes. ENT: Positive for sore throat; negative for difficulty swallowing. Respiratory: Denies shortness of breath. Gastrointestinal: No abdominal pain.  No nausea, no vomiting.  No diarrhea. Genitourinary: Negative for dysuria. Musculoskeletal: Positive for generalized body aches. Skin: Negative for  rash. Neurological: Positive for headaches, negative focal weakness or numbness.  ____________________________________________   PHYSICAL EXAM:  VITAL SIGNS: ED Triage Vitals  Enc Vitals Group     BP 12/02/17 0944 130/84     Pulse Rate 12/02/17 0944 71     Resp 12/02/17 0944 16     Temp 12/02/17 0944 98.2 F (36.8 C)     Temp Source 12/02/17 0944 Oral     SpO2 12/02/17 0944 100 %     Weight 12/02/17 0945 243 lb (110.2 kg)     Height 12/02/17 0945 5\' 4"  (1.626 m)     Head Circumference --      Peak Flow --      Pain Score 12/02/17 0944 4     Pain Loc --      Pain Edu? --      Excl. in [place]? --    Constitutional: Alert and oriented. Well appearing and in no acute distress. Eyes: Conjunctivae are normal.  Head: Atraumatic. Nose: No congestion/rhinnorhea. Mouth/Throat: Mucous membranes are moist.  Oropharynx erythematous, tonsils 2+ with exudate. Uvula is midline. Neck: No stridor.  Lymphatic: Lymphadenopathy: Left anterior cervical node is tender palpable Cardiovascular: Normal rate, regular rhythm. Good peripheral circulation. Respiratory: Normal respiratory effort. Lungs CTAB. Gastrointestinal: Soft and nontender. Musculoskeletal: No lower extremity tenderness nor edema.  Neurologic:  Normal speech and language. No gross focal neurologic deficits are appreciated. Speech is normal. No gait instability. Skin:  Skin is warm, dry and intact. No rash noted Psychiatric: Mood and affect are normal. Speech and behavior  are normal.  ____________________________________________   LABS (all labs ordered are listed, but only abnormal results are displayed)  Labs Reviewed - No data to display ____________________________________________  EKG  Not indicated ____________________________________________  RADIOLOGY  Not indicated ____________________________________________   PROCEDURES  Procedure(s) performed: None  Critical Care performed:  No ____________________________________________   INITIAL IMPRESSION / ASSESSMENT AND PLAN / ED COURSE  41 year old female presenting to the emergency department for evaluation and treatment of sore throat.  Symptoms and exam are consistent with streptococcal pharyngitis and she will be started on amoxicillin.  She was encouraged to take ibuprofen in addition to the amoxicillin.  She was encouraged to follow-up with the primary care provider for symptoms that are improving over the next couple of days.  She was encouraged to return to the emergency department for symptoms of change or worsen if unable to schedule an appointment.  Pertinent labs & imaging results that were available during my care of the patient were reviewed by me and considered in my medical decision making (see chart for details). ____________________________________________  This SmartLink is deprecated. Use AVSMEDLIST instead to display the medication list for a patient.  FINAL CLINICAL IMPRESSION(S) / ED DIAGNOSES  Final diagnoses:  Exudative pharyngitis    If controlled substance prescribed during this visit, 12 month history viewed on the [place] prior to issuing an initial prescription for Schedule II or III opiod.   Note:  This document was prepared using Dragon voice recognition software and may include unintentional dictation errors.    Victorino Dike, FNP 12/02/17 [place], [place], MD 12/03/17 1357

## 2017-12-02 NOTE — ED Triage Notes (Signed)
Pt to ED c/o Sore throat x 1 day. Pt states that she feels like she has bumps on the back of her tongue and that her throat is raw. Pt in NAD at this time.

## 2017-12-02 NOTE — ED Notes (Signed)
See triage note  Presents with a 2 day hx of sore throat feels like throat is burning/raw  No fever   Afebrile on arrival

## 2018-02-05 ENCOUNTER — Emergency Department
Admission: EM | Admit: 2018-02-05 | Discharge: 2018-02-05 | Disposition: A | Discharge status: Home / Self Care | Payer: 59 | Attending: Emergency Medicine | Admitting: Emergency Medicine

## 2018-02-05 ENCOUNTER — Encounter: Payer: Self-pay | Admitting: Emergency Medicine

## 2018-02-05 DIAGNOSIS — R0981 Nasal congestion: Secondary | ICD-10-CM | POA: Insufficient documentation

## 2018-02-05 DIAGNOSIS — F17210 Nicotine dependence, cigarettes, uncomplicated: Secondary | ICD-10-CM | POA: Diagnosis not present

## 2018-02-05 DIAGNOSIS — B9789 Other viral agents as the cause of diseases classified elsewhere: Secondary | ICD-10-CM

## 2018-02-05 DIAGNOSIS — J029 Acute pharyngitis, unspecified: Secondary | ICD-10-CM | POA: Diagnosis not present

## 2018-02-05 DIAGNOSIS — R05 Cough: Secondary | ICD-10-CM | POA: Diagnosis present

## 2018-02-05 DIAGNOSIS — M7918 Myalgia, other site: Secondary | ICD-10-CM | POA: Diagnosis not present

## 2018-02-05 DIAGNOSIS — Z882 Allergy status to sulfonamides status: Secondary | ICD-10-CM | POA: Insufficient documentation

## 2018-02-05 DIAGNOSIS — J3489 Other specified disorders of nose and nasal sinuses: Secondary | ICD-10-CM | POA: Diagnosis not present

## 2018-02-05 DIAGNOSIS — J069 Acute upper respiratory infection, unspecified: Secondary | ICD-10-CM | POA: Insufficient documentation

## 2018-02-05 LAB — INFLUENZA PANEL BY PCR (TYPE A & B)
Influenza A By PCR: NEGATIVE
Influenza B By PCR: NEGATIVE

## 2018-02-05 MED ORDER — KETOROLAC TROMETHAMINE 60 MG/2ML IM SOLN
60.0000 mg | Freq: Once | INTRAMUSCULAR | Status: AC
  Administered 2018-02-05: 60 mg via INTRAMUSCULAR
  Filled 2018-02-05: qty 2

## 2018-02-05 MED ORDER — IBUPROFEN 800 MG PO TABS: 800.0000 mg | ORAL_TABLET | Freq: Three times a day (TID) | ORAL | 0 refills | Status: DC | PRN

## 2018-02-05 MED ORDER — PSEUDOEPH-BROMPHEN-DM 30-2-10 MG/5ML PO SYRP: 5.0000 mL | ORAL_SOLUTION | Freq: Four times a day (QID) | ORAL | 0 refills | Status: DC | PRN

## 2018-02-05 MED ORDER — BENZONATATE 100 MG PO CAPS
200.0000 mg | ORAL_CAPSULE | Freq: Once | ORAL | Status: AC
  Administered 2018-02-05: 200 mg via ORAL
  Filled 2018-02-05: qty 2

## 2018-02-05 NOTE — ED Provider Notes (Signed)
[facility] Emergency Department Provider Note   ____________________________________________   First MD Initiated Contact with Patient 02/05/18 1459     (approximate)  I have reviewed the triage vital signs and the nursing notes.   HISTORY  Chief Complaint Sore Throat and Cough    HPI Gabrielle Watts is a 42 y.o. female patient complain of nasal congestion intermittent rhinorrhea, cough, generalized body aches, and chest congestion.  Patient denies nausea, vomiting, diarrhea.  Rates the pain as a 5/10.  Patient described the pain is "generalized aching".  Patient hurts to swallow.  No palliative measures for complaint.  Patient not taking flu shot for this complaint.  Onset of complaint was yesterday.  Past Medical History:  Diagnosis Date  . Cancer ([place])   . Ectopic pregnancy     There are no active problems to display for this patient.   Past Surgical History:  Procedure Laterality Date  . BILATERAL SALPINGECTOMY    . CESAREAN SECTION    . VULVA SURGERY      Prior to Admission medications   Medication Sig Start Date End Date Taking? Authorizing Provider  amoxicillin (AMOXIL) 500 MG tablet Take 1 tablet (500 mg total) by mouth 3 (three) times daily. 12/02/17   Triplett, Johnette Abraham B, FNP  brompheniramine-pseudoephedrine-DM 30-2-10 MG/5ML syrup Take 5 mLs by mouth 4 (four) times daily as needed. 02/05/18   Sable Feil, PA-C  HYDROcodone-acetaminophen (NORCO/VICODIN) 5-325 MG tablet Take 1 tablet by mouth every 4 (four) hours as needed. 09/12/17   Harvest Dark, MD  ibuprofen (ADVIL,MOTRIN) 800 MG tablet Take 1 tablet (800 mg total) by mouth every 8 (eight) hours as needed for moderate pain. 02/05/18   Sable Feil, PA-C    Allergies Sulfa antibiotics  No family history on file.  Social History Social History   Tobacco Use  . Smoking status: Current Every Day Smoker    Packs/day: 0.50    Types: Cigarettes  . Smokeless tobacco:  Never Used  Substance Use Topics  . Alcohol use: Yes    Comment: occasional  . Drug use: No    Review of Systems Constitutional: Chills and body aches eyes: No visual changes. ENT: Nasal congestion and sore throat cardiovascular: Denies chest pain. Respiratory: Denies shortness of breath.  Nonproductive cough Gastrointestinal: No abdominal pain.  No nausea, no vomiting.  No diarrhea.  No constipation. Genitourinary: Negative for dysuria. Musculoskeletal: Negative for back pain. Skin: Negative for rash. Neurological: Negative for headaches, focal weakness or numbness. Hematological/Lymphatic: Allergic/Immunilogical: Sulfa antibiotics ____________________________________________   PHYSICAL EXAM:  VITAL SIGNS: ED Triage Vitals  Enc Vitals Group     BP 02/05/18 1402 (!) 144/73     Pulse Rate 02/05/18 1402 82     Resp 02/05/18 1402 18     Temp 02/05/18 1402 98.8 F (37.1 C)     Temp Source 02/05/18 1402 Oral     SpO2 02/05/18 1402 100 %     Weight --      Height 02/05/18 1409 5\' 4"  (1.626 m)     Head Circumference --      Peak Flow --      Pain Score 02/05/18 1409 5     Pain Loc --      Pain Edu? --      Excl. in [place]? --    Constitutional: Alert and oriented. Well appearing and in no acute distress. Nose: Edematous nasal turbinates clear rhinorrhea.  Bilateral maxillary sinus guarding. Mouth/Throat: Mucous  membranes are moist.  Oropharynx  erythematous.  Nonexudative tonsils with postnasal drainage. Neck: No stridor.  No cervical spine tenderness to palpation. Cardiovascular: Normal rate, regular rhythm. Grossly normal heart sounds.  Good peripheral circulation. Respiratory: Normal respiratory effort.  No retractions. Lungs CTAB. Neurologic:  Normal speech and language. No gross focal neurologic deficits are appreciated. No gait instability. Skin:  Skin is warm, dry and intact. No rash noted. Psychiatric: Mood and affect are normal. Speech and behavior are  normal.  ____________________________________________   LABS (all labs ordered are listed, but only abnormal results are displayed)  Labs Reviewed  INFLUENZA PANEL BY PCR (TYPE A & B)   ____________________________________________  EKG   ____________________________________________  RADIOLOGY  ED MD interpretation:    Official radiology report(s): No results found.  ____________________________________________   PROCEDURES  Procedure(s) performed: None  Procedures  Critical Care performed: No  ____________________________________________   INITIAL IMPRESSION / ASSESSMENT AND PLAN / ED COURSE  As part of my medical decision making, I reviewed the following data within the electronic MEDICAL RECORD NUMBER    Viral respiratory infection.  Discussed negative flu results with patient.  Patient given discharge care instruction work note.  Patient advised to follow-up with the [facility] if condition persists.      ____________________________________________   FINAL CLINICAL IMPRESSION(S) / ED DIAGNOSES  Final diagnoses:  Viral URI with cough     ED Discharge Orders        Ordered    brompheniramine-pseudoephedrine-DM 30-2-10 MG/5ML syrup  4 times daily PRN     02/05/18 1636    ibuprofen (ADVIL,MOTRIN) 800 MG tablet  Every 8 hours PRN     02/05/18 1636       Note:  This document was prepared using Dragon voice recognition software and may include unintentional dictation errors.    Sable Feil, PA-C 02/05/18 1640    Nance Pear, MD 02/05/18 [phone]

## 2018-02-05 NOTE — ED Triage Notes (Signed)
Pt reports sore throat, cough and generalized body aches.

## 2018-03-09 ENCOUNTER — Other Ambulatory Visit: Payer: Self-pay

## 2018-03-09 ENCOUNTER — Emergency Department
Admission: EM | Admit: 2018-03-09 | Discharge: 2018-03-09 | Disposition: A | Discharge status: Home / Self Care | Payer: 59 | Attending: Emergency Medicine | Admitting: Emergency Medicine

## 2018-03-09 ENCOUNTER — Encounter: Payer: Self-pay | Admitting: Emergency Medicine

## 2018-03-09 ENCOUNTER — Emergency Department: Payer: 59

## 2018-03-09 DIAGNOSIS — R202 Paresthesia of skin: Secondary | ICD-10-CM | POA: Diagnosis present

## 2018-03-09 DIAGNOSIS — Z8544 Personal history of malignant neoplasm of other female genital organs: Secondary | ICD-10-CM | POA: Diagnosis not present

## 2018-03-09 DIAGNOSIS — F17210 Nicotine dependence, cigarettes, uncomplicated: Secondary | ICD-10-CM | POA: Diagnosis not present

## 2018-03-09 DIAGNOSIS — R42 Dizziness and giddiness: Secondary | ICD-10-CM | POA: Diagnosis not present

## 2018-03-09 LAB — HCG, QUANTITATIVE, PREGNANCY: hCG, Beta Chain, Quant, S: <1 m[IU]/mL (ref <5)

## 2018-03-09 LAB — COMPREHENSIVE METABOLIC PANEL
ALT: 21 U/L (ref 14–54)
AST: 20 U/L (ref 15–41)
Albumin: 3.5 g/dL (ref 3.5–5.0)
Alkaline Phosphatase: 65 U/L (ref 38–126)
Anion gap: 6 (ref 5–15)
BUN: 14 mg/dL (ref 6–20)
CHLORIDE: 107 mmol/L (ref 101–111)
CO2: 25 mmol/L (ref 22–32)
Calcium: 8.2 mg/dL — ABNORMAL LOW (ref 8.9–10.3)
Creatinine, Ser: 0.87 mg/dL (ref 0.44–1.00)
GFR calc non Af Amer: >60 mL/min (ref >60)
Glucose, Bld: 79 mg/dL (ref 65–99)
Potassium: 3.5 mmol/L (ref 3.5–5.1)
SODIUM: 138 mmol/L (ref 135–145)
Total Bilirubin: 0.8 mg/dL (ref 0.3–1.2)
Total Protein: 6.7 g/dL (ref 6.5–8.1)

## 2018-03-09 LAB — CBC
HCT: 36.9 % (ref 35.0–47.0)
Hemoglobin: 12.8 g/dL (ref 12.0–16.0)
MCH: 29.6 pg (ref 26.0–34.0)
MCHC: 34.6 g/dL (ref 32.0–36.0)
MCV: 85.5 fL (ref 80.0–100.0)
PLATELETS: 227 10*3/uL (ref 150–440)
RBC: 4.31 MIL/uL (ref 3.80–5.20)
RDW: 14.5 % (ref 11.5–14.5)
WBC: 7.1 10*3/uL (ref 3.6–11.0)

## 2018-03-09 LAB — TROPONIN I: Troponin I: <0.03 ng/mL (ref <0.03)

## 2018-03-09 LAB — MAGNESIUM: Magnesium: 2 mg/dL (ref 1.7–2.4)

## 2018-03-09 NOTE — ED Notes (Signed)
Patient to Room 16, Gertie Exon RN aware of placement in room.

## 2018-03-09 NOTE — ED Provider Notes (Signed)
[facility] Emergency Department Provider Note  ____________________________________________  Time seen: Approximately 8:17 AM  I have reviewed the triage vital signs and the nursing notes.   HISTORY  Chief Complaint Tingling   HPI Gabrielle Watts is a 42 y.o. 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 Patient reports for the last week she has had several episodes of bilateral tingling. She reports that it feels like her hands are asleep. All episodes include bilateral upper extremities and now one episode including her lips. No lower extremity involvement. Sometimes the numbness involves the fingertips only, sometimes the arms from the elbow down and this morning it involved her upper and lower bilateral lips as well. these episodes can last anywhere from a few seconds to several minutes and resolve without any intervention. At this time she is complaining that both her hands feel numb. She denies any motor weakness, headaches, personal or family history of stroke/ MS/ MG. She denies unilateral weakness or numbness, facial droop, slurred speech, gait instability. 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 She works in a Designer, television/film set carrying heavy things. She denies recent illness, palpitations, cp, sob associated with these episodes.   Past Medical History:  Diagnosis Date  . Cancer ([place])    Vulvar cancer  . Ectopic pregnancy     There are no active problems to display for this patient.   Past Surgical History:  Procedure Laterality Date  . BILATERAL SALPINGECTOMY    . CATARACT EXTRACTION    . CESAREAN SECTION    . VULVA SURGERY      Prior to Admission medications   Medication Sig Start Date End Date Taking? Authorizing Provider  ibuprofen  (ADVIL,MOTRIN) 200 MG tablet Take 200-400 mg by mouth every 6 (six) hours as needed.   Yes [provider]    Allergies Sulfa antibiotics  No family history on file.  Social History Social History   Tobacco Use  . Smoking status: Current Every Day Smoker    Packs/day: 0.50    Types: Cigarettes  . Smokeless tobacco: Never Used  Substance Use Topics  . Alcohol use: Yes    Comment: occasional  . Drug use: No    Review of Systems  Constitutional: Negative for fever. + dizziness Eyes: Negative for visual changes. ENT: Negative for sore throat. Neck: No neck pain  Cardiovascular: Negative for chest pain. Respiratory: Negative for shortness of breath. Gastrointestinal: Negative for abdominal pain, vomiting or diarrhea. Genitourinary: Negative for dysuria. Musculoskeletal: Negative for back pain. Skin: Negative for rash. Neurological: Negative for headaches, weakness. + numbness/ tingling of b/l arms, hands, and lips Psych: No SI or HI  ____________________________________________   PHYSICAL EXAM:  VITAL SIGNS: ED Triage Vitals  Enc Vitals Group     BP 03/09/18 0657 (!) 145/70     Pulse Rate 03/09/18 0657 72     Resp 03/09/18 0657 20     Temp 03/09/18 0657 98 F (36.7 C)     Temp Source 03/09/18 0657 Oral     SpO2 03/09/18 0657 100 %     Weight 03/09/18 0658 242 lb (109.8 kg)     Height 03/09/18 0658 5\' 4"  (1.626 m)     Head Circumference --      Peak Flow --  Pain Score --      Pain Loc --      Pain Edu? --      Excl. in [place]? --     Constitutional: Alert and oriented. Well appearing and in no apparent distress. HEENT:      Head: Normocephalic and atraumatic.         Eyes: Conjunctivae are normal. Sclera is non-icteric.       Mouth/Throat: Mucous membranes are moist.       Neck: Supple with no signs of meningismus. Cardiovascular: Regular rate and rhythm. No murmurs, gallops, or rubs. 2+ symmetrical distal pulses are present in all extremities.  No JVD. Respiratory: Normal respiratory effort. Lungs are clear to auscultation bilaterally. No wheezes, crackles, or rhonchi.  Gastrointestinal: Soft, non tender, and non distended with positive bowel sounds. No rebound or guarding. Genitourinary: No CVA tenderness. Musculoskeletal: Nontender with normal range of motion in all extremities. No edema, cyanosis, or erythema of extremities. Neurologic: Normal speech and language. A & O x3, PERRL, EOMI, no nystagmus, CN II-XII intact, motor testing reveals good tone and bulk throughout. There is no evidence of pronator drift or dysmetria. Muscle strength is 5/5 throughout.  Sensory examination is intact. Gait is normal. Skin: Skin is warm, dry and intact. No rash noted. Psychiatric: Mood and affect are normal. Speech and behavior are normal.  ____________________________________________   LABS (all labs ordered are listed, but only abnormal results are displayed)  Labs Reviewed  COMPREHENSIVE METABOLIC PANEL - Abnormal; Notable for the following components:      Result Value   Calcium 8.2 (*)    All other components within normal limits  CBC  TROPONIN I  MAGNESIUM  HCG, QUANTITATIVE, PREGNANCY   ____________________________________________  EKG  ED ECG REPORT I, Rudene Re, the attending physician, personally viewed and interpreted this ECG.  Normal sinus rhythm, rate of 64, normal intervals, normal axis, low voltage QRS, no ST elevations or depressions. T-wave inversion in lead V2. no prior for comparison. ____________________________________________  RADIOLOGY  I have personally reviewed the images performed during this visit and I agree with the Radiologist's read.   Interpretation by Radiologist:  Ct Head Wo Contrast  Result Date: 03/09/2018 CLINICAL DATA:  Extremity numbness. EXAM: CT HEAD WITHOUT CONTRAST TECHNIQUE: Contiguous axial images were obtained from the base of the skull through the vertex without  intravenous contrast. COMPARISON:  None. FINDINGS: Brain: No evidence of acute infarction, hemorrhage, hydrocephalus, extra-axial collection or mass lesion/mass effect. Normal cerebral volume. No white matter disease. Vascular: No hyperdense vessel or unexpected calcification. Skull: Normal. Negative for fracture or focal lesion. Sinuses/Orbits: No acute finding. Other: None. IMPRESSION: Negative exam. Electronically Signed   By: Staci Righter M.D.   On: 03/09/2018 07:46     ____________________________________________   PROCEDURES  Procedure(s) performed: None Procedures Critical Care performed:  None ____________________________________________   INITIAL IMPRESSION / ASSESSMENT AND PLAN / ED COURSE   42 y.o. 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 symptoms are always bilateral and resolved with no intervention. Patient is neurologically intact other than mild decrease sensation to touch on bilateral hands.No evidence of stroke on exam and history. Head CT negative. Labs showing normal electrolytes. Discussed with Dr. Doy Mince who also agrees this is less likely to be neurological in nature and possibly metabolic. Patient will be dc home with f/u with PCP for  further evaluation. Discussed signs or symptoms of stroke, MS, MG and recommended return if these develop.      As part of my medical decision making, I reviewed the following data within the [facility] EKG, Nursing notes reviewed and incorporated, Labs reviewed , Radiograph reviewed , A consult was requested and obtained from this/these consultant(s) Neurology, Notes from prior ED visits and [place] Controlled Substance Database    Pertinent labs & imaging results that were available during my care of the patient were reviewed by me and considered in my medical decision  making (see chart for details).    ____________________________________________   FINAL CLINICAL IMPRESSION(S) / ED DIAGNOSES  Final diagnoses:  Paresthesias      NEW MEDICATIONS STARTED DURING THIS VISIT:  ED Discharge Orders    None       Note:  This document was prepared using Dragon voice recognition software and may include unintentional dictation errors.    Alfred Levins, [place], MD 03/09/18 1023

## 2018-03-09 NOTE — ED Notes (Signed)
Patient presents to the ED with numbness and tingling in her hands x 2 days and tingling in her arms today with occasional tingling in her lips.  Patient reports yesterday feeling light headed and almost fainting while at work.  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  Patient denies chest pain, vomiting and diarrhea.  Denies dizziness and confusion.  Will continue to monitor.

## 2018-03-09 NOTE — ED Triage Notes (Signed)
Patient states yesterday morning, she woke up with fingertips tingling/numb. Today, woke up with legs and arms were tingling/numb. States yesterday she felt like she was going "to pass out" a couple times. States she feels better in that regard today, but feels as though "if I exerted myself today, it might happen". Patient ambulatory to triage without difficulty. Denies any recent illness or injury.

## 2018-04-03 ENCOUNTER — Encounter: Payer: Self-pay | Admitting: Emergency Medicine

## 2018-04-03 ENCOUNTER — Other Ambulatory Visit: Payer: Self-pay

## 2018-04-03 ENCOUNTER — Emergency Department
Admission: EM | Admit: 2018-04-03 | Discharge: 2018-04-03 | Disposition: A | Discharge status: Home / Self Care | Payer: 59 | Attending: Emergency Medicine | Admitting: Emergency Medicine

## 2018-04-03 DIAGNOSIS — B9789 Other viral agents as the cause of diseases classified elsewhere: Secondary | ICD-10-CM | POA: Insufficient documentation

## 2018-04-03 DIAGNOSIS — Z8544 Personal history of malignant neoplasm of other female genital organs: Secondary | ICD-10-CM | POA: Insufficient documentation

## 2018-04-03 DIAGNOSIS — J069 Acute upper respiratory infection, unspecified: Secondary | ICD-10-CM | POA: Insufficient documentation

## 2018-04-03 DIAGNOSIS — N3001 Acute cystitis with hematuria: Secondary | ICD-10-CM | POA: Insufficient documentation

## 2018-04-03 DIAGNOSIS — F17210 Nicotine dependence, cigarettes, uncomplicated: Secondary | ICD-10-CM | POA: Insufficient documentation

## 2018-04-03 LAB — URINALYSIS, COMPLETE (UACMP) WITH MICROSCOPIC
Bilirubin Urine: NEGATIVE
Glucose, UA: NEGATIVE mg/dL
Ketones, ur: NEGATIVE mg/dL
Leukocytes, UA: NEGATIVE
NITRITE: NEGATIVE
PROTEIN: NEGATIVE mg/dL
Specific Gravity, Urine: 1.024 (ref 1.005–1.030)
pH: 5 (ref 5.0–8.0)

## 2018-04-03 LAB — INFLUENZA PANEL BY PCR (TYPE A & B)
INFLAPCR: NEGATIVE
Influenza B By PCR: NEGATIVE

## 2018-04-03 MED ORDER — CEPHALEXIN 500 MG PO CAPS: 500.0000 mg | ORAL_CAPSULE | Freq: Four times a day (QID) | ORAL | 0 refills | Status: AC

## 2018-04-03 NOTE — ED Triage Notes (Signed)
Pt arrived to the ED for complaints of flu like symptoms, Pt reports generalized body aches, fever and cough for the last day. Pt states that the symptoms do not improve with over the counter medication. Pt also reports foul smelling urine and urinary discomfort.

## 2018-04-03 NOTE — ED Provider Notes (Signed)
[facility] Emergency Department Provider Note  ____________________________________________  Time seen: Approximately 10:58 PM  I have reviewed the triage vital signs and the nursing notes.   HISTORY  Chief Complaint Chills; Generalized Body Aches; and Cough    HPI Gabrielle Watts is a 42 y.o. female presents to the emergency department with low-grade fever, rhinorrhea, congestion, nonproductive cough and malaise for the past 2 days.  Patient secondarily reports dysuria and increased urinary frequency.  Patient reports her last episode of cystitis occurred approximately a year ago.  She denies changes in vaginal discharge or sexual promiscuity and has no concerns for STDs.  No changes in vaginal discharge.   Past Medical History:  Diagnosis Date  . Cancer ([place])    Vulvar cancer  . Ectopic pregnancy     There are no active problems to display for this patient.   Past Surgical History:  Procedure Laterality Date  . BILATERAL SALPINGECTOMY    . CATARACT EXTRACTION    . CESAREAN SECTION    . VULVA SURGERY      Prior to Admission medications   Medication Sig Start Date End Date Taking? Authorizing Provider  cephALEXin (KEFLEX) 500 MG capsule Take 1 capsule (500 mg total) by mouth 4 (four) times daily for 10 days. 04/03/18 04/13/18  Lannie Fields, PA-C  ibuprofen (ADVIL,MOTRIN) 200 MG tablet Take 200-400 mg by mouth every 6 (six) hours as needed.    [provider]    Allergies Sulfa antibiotics  History reviewed. No pertinent family history.  Social History Social History   Tobacco Use  . Smoking status: Current Every Day Smoker    Packs/day: 0.50    Types: Cigarettes  . Smokeless tobacco: Never Used  Substance Use Topics  . Alcohol use: Yes    Comment: occasional  . Drug use: No      Review of Systems  Constitutional: Patient has fever.  Eyes: No visual changes. No discharge ENT: Patient has congestion.   Cardiovascular: no chest pain. Respiratory: Patient has cough.  Gastrointestinal: No abdominal pain.  No nausea, no vomiting. Patient had diarrhea.  Genitourinary: Patient has dysuria and increased urinary frequency.  Musculoskeletal: Patient has myalgias.  Skin: Negative for rash, abrasions, lacerations, ecchymosis. Neurological: Patient has headache, no focal weakness or numbness.      ____________________________________________   PHYSICAL EXAM:  VITAL SIGNS: ED Triage Vitals  Enc Vitals Group     BP 04/03/18 2104 (!) 146/83     Pulse Rate 04/03/18 2104 99     Resp 04/03/18 2104 20     Temp 04/03/18 2104 (!) 100.4 F (38 C)     Temp Source 04/03/18 2104 Oral     SpO2 04/03/18 2103 97 %     Weight 04/03/18 2104 241 lb (109.3 kg)     Height 04/03/18 2104 5\' 4"  (1.626 m)     Head Circumference --      Peak Flow --      Pain Score 04/03/18 2104 7     Pain Loc --      Pain Edu? --      Excl. in [place]? --    Constitutional: Alert and oriented. Patient is lying supine. Eyes: Conjunctivae are normal. PERRL. EOMI. Head: Atraumatic. ENT:      Ears: Tympanic membranes are mildly injected with mild effusion bilaterally.       Nose: No congestion/rhinnorhea.      Mouth/Throat: Mucous membranes are moist. Posterior pharynx is mildly  erythematous.  Hematological/Lymphatic/Immunilogical: No cervical lymphadenopathy.  Cardiovascular: Normal rate, regular rhythm. Normal S1 and S2.  Good peripheral circulation. Respiratory: Normal respiratory effort without tachypnea or retractions. Lungs CTAB. Good air entry to the bases with no decreased or absent breath sounds. Gastrointestinal: Bowel sounds 4 quadrants. Soft and nontender to palpation. No guarding or rigidity. No palpable masses. No distention. No CVA tenderness. Musculoskeletal: Full range of motion to all extremities. No gross deformities appreciated. Neurologic:  Normal speech and language. No gross focal neurologic deficits  are appreciated.  Skin:  Skin is warm, dry and intact. No rash noted. Psychiatric: Mood and affect are normal. Speech and behavior are normal. Patient exhibits appropriate insight and judgement.  ____________________________________________   LABS (all labs ordered are listed, but only abnormal results are displayed)  Labs Reviewed  URINALYSIS, COMPLETE (UACMP) WITH MICROSCOPIC - Abnormal; Notable for the following components:      Result Value   Color, Urine YELLOW (*)    APPearance CLOUDY (*)    Hgb urine dipstick MODERATE (*)    Bacteria, UA RARE (*)    Squamous Epithelial / LPF 6-30 (*)    All other components within normal limits  INFLUENZA PANEL BY PCR (TYPE A & B)   ____________________________________________  EKG   ____________________________________________  RADIOLOGY   No results found.  ____________________________________________    PROCEDURES  Procedure(s) performed:    Procedures    Medications - No data to display   ____________________________________________   INITIAL IMPRESSION / ASSESSMENT AND PLAN / ED COURSE  Pertinent labs & imaging results that were available during my care of the patient were reviewed by me and considered in my medical decision making (see chart for details).  Review of the [place] CSRS was performed in accordance of the [place] prior to dispensing any controlled drugs.     Assessment and plan Cystitis Viral URI Patient presents to the emergency department with rhinorrhea, congestion, nonproductive cough, low-grade fever, dysuria, increased urinary frequency and low back pain.  Differential diagnosis included unspecified viral URI and cystitis.  Patient was treated empirically with Keflex for acute cystitis and supportive measures were given for viral URI.  A work note was also provided.  All patient questions were answered.   ____________________________________________  FINAL CLINICAL IMPRESSION(S) / ED  DIAGNOSES  Final diagnoses:  Viral upper respiratory tract infection  Acute cystitis with hematuria      NEW MEDICATIONS STARTED DURING THIS VISIT:  ED Discharge Orders        Ordered    cephALEXin (KEFLEX) 500 MG capsule  4 times daily     04/03/18 2238          This chart was dictated using voice recognition software/Dragon. Despite best efforts to proofread, errors can occur which can change the meaning. Any change was purely unintentional.    Lannie Fields, PA-C 04/03/18 9179    Darel Hong, MD 04/03/18 2326

## 2018-04-27 ENCOUNTER — Emergency Department
Admission: EM | Admit: 2018-04-27 | Discharge: 2018-04-27 | Disposition: A | Discharge status: Home / Self Care | Payer: Worker's Compensation | Attending: Emergency Medicine | Admitting: Emergency Medicine

## 2018-04-27 ENCOUNTER — Other Ambulatory Visit: Payer: Self-pay

## 2018-04-27 ENCOUNTER — Encounter: Payer: Self-pay | Admitting: Emergency Medicine

## 2018-04-27 DIAGNOSIS — F17210 Nicotine dependence, cigarettes, uncomplicated: Secondary | ICD-10-CM | POA: Insufficient documentation

## 2018-04-27 DIAGNOSIS — M25521 Pain in right elbow: Secondary | ICD-10-CM | POA: Diagnosis present

## 2018-04-27 DIAGNOSIS — M7712 Lateral epicondylitis, left elbow: Secondary | ICD-10-CM | POA: Insufficient documentation

## 2018-04-27 DIAGNOSIS — M7711 Lateral epicondylitis, right elbow: Secondary | ICD-10-CM | POA: Insufficient documentation

## 2018-04-27 MED ORDER — PREDNISONE 10 MG PO TABS: ORAL_TABLET | ORAL | 0 refills | Status: DC

## 2018-04-27 NOTE — ED Notes (Signed)
See triage note  Presents with pain to both elbows  Pain is worse in the right  Denies any specific trauma but does a lot of lifting at work  No deformity noted but area is tender on palpation

## 2018-04-27 NOTE — ED Provider Notes (Signed)
[facility] EMERGENCY DEPARTMENT Provider Note   CSN: [id number] Arrival date & time: 04/27/18  1841     History   Chief Complaint Chief Complaint  Patient presents with  . Elbow Pain    HPI Gabrielle Watts is a 42 y.o. female presents to the emergency department for evaluation of right greater than left elbow pain.  Elbow pain is been nontraumatic.  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  Pain is been present for 8 weeks.  She is been taking ibuprofen 800 mg 3 times daily with food with minimal improvement.  She denies any numbness tingling or radicular symptoms.  She has pain with grasping and gripping.  She points to the lateral epicondyles bilaterally.  Pain is moderate to severe.  HPI  Past Medical History:  Diagnosis Date  . Cancer ([place])    Vulvar cancer  . Ectopic pregnancy     There are no active problems to display for this patient.   Past Surgical History:  Procedure Laterality Date  . BILATERAL SALPINGECTOMY    . CATARACT EXTRACTION    . CESAREAN SECTION    . VULVA SURGERY       OB History   None      Home Medications    Prior to Admission medications   Medication Sig Start Date End Date Taking? Authorizing Provider  ibuprofen (ADVIL,MOTRIN) 200 MG tablet Take 200-400 mg by mouth every 6 (six) hours as needed.    [provider]  predniSONE (DELTASONE) 10 MG tablet 10 day taper. 5,5,4,4,3,3,2,2,1,1 04/27/18   Duanne Guess, PA-C    Family History History reviewed. No pertinent family history.  Social History Social History   Tobacco Use  . Smoking status: Current Every Day Smoker    Packs/day: 0.50    Types: Cigarettes  . Smokeless tobacco: Never Used  Substance Use Topics  . Alcohol use: Yes    Comment: occasional  . Drug use: No     Allergies   Sulfa antibiotics   Review of Systems Review of Systems  Constitutional: Negative for fever.    Respiratory: Negative for shortness of breath.   Cardiovascular: Negative for chest pain.  Gastrointestinal: Negative for abdominal pain.  Genitourinary: Negative for difficulty urinating, dysuria and urgency.  Musculoskeletal: Positive for arthralgias. Negative for back pain, joint swelling, myalgias and neck pain.  Skin: Negative for rash.  Neurological: Negative for dizziness, weakness and headaches.     Physical Exam Updated Vital Signs BP (!) 146/71 (BP Location: Left Arm)   Pulse 78   Temp 98.7 F (37.1 C) (Oral)   Resp 18   Ht 5\' 4"  (1.626 m)   Wt 112.9 kg (249 lb)   LMP 04/11/2018 (Within Days)   SpO2 99%   BMI 42.74 kg/m   Physical Exam  Constitutional: She is oriented to person, place, and time. She appears well-developed and well-nourished.  HENT:  Head: Normocephalic and atraumatic.  Eyes: Conjunctivae are normal.  Neck: Normal range of motion.  Cardiovascular: Normal rate.  Pulmonary/Chest: Effort normal. No respiratory distress.  Musculoskeletal: Normal range of motion.  Normal range of motion bilateral shoulders elbows wrist and digits.  She is tender along the lateral epicondyles of both elbows and has painful resisted wrist extension bilaterally.  She has no weakness with biceps, triceps and grip strength bilaterally.  Neurological: She is alert and oriented to person, place, and time.  Skin: Skin is  warm. No rash noted.  Psychiatric: She has a normal mood and affect. Her behavior is normal. Thought content normal.     ED Treatments / Results  Labs (all labs ordered are listed, but only abnormal results are displayed) Labs Reviewed - No data to display  EKG None  Radiology No results found.  Procedures Procedures (including critical care time)  Medications Ordered in ED Medications - No data to display   Initial Impression / Assessment and Plan / ED Course  I have reviewed the triage vital signs and the nursing notes.  Pertinent labs &  imaging results that were available during my care of the patient were reviewed by me and considered in my medical decision making (see chart for details).     42 year old female with right greater than left lateral epicondylitis.  She will start 10-day prednisone taper and follow-up with orthopedics in 10 days if no improvement.  She is educated on stretching exercises, tennis elbow straps.  Final Clinical Impressions(s) / ED Diagnoses   Final diagnoses:  Lateral epicondylitis of both elbows    ED Discharge Orders        Ordered    predniSONE (DELTASONE) 10 MG tablet     04/27/18 1929       Renata Caprice 04/27/18 1937    Schuyler Amor, MD 04/27/18 2221

## 2018-04-27 NOTE — ED Triage Notes (Signed)
Pt c/o bilateral elbow pain for "a very long time" with worsening over the last few days. Pt is alert and oriented, full ROM noted in triage.

## 2018-04-27 NOTE — Discharge Instructions (Addendum)
Please wear tennis elbow straps at all times except for showering.  Discontinue ibuprofen and start 10-day steroid taper.  Avoid lifting with the palm down, left with the palm up.  If no improvement in 10 days follow-up with orthopedist.

## 2018-05-01 ENCOUNTER — Other Ambulatory Visit: Payer: Self-pay | Admitting: Physician Assistant

## 2018-05-01 ENCOUNTER — Ambulatory Visit
Admission: RE | Admit: 2018-05-01 | Discharge: 2018-05-01 | Disposition: A | Discharge status: Home / Self Care | Payer: Worker's Compensation | Source: Ambulatory Visit | Attending: Physician Assistant | Admitting: Physician Assistant

## 2018-05-01 DIAGNOSIS — M25521 Pain in right elbow: Secondary | ICD-10-CM

## 2018-05-01 DIAGNOSIS — M25522 Pain in left elbow: Secondary | ICD-10-CM

## 2019-07-02 ENCOUNTER — Telehealth
Admit: 2019-07-02 | Discharge: 2019-07-02 | Payer: PRIVATE HEALTH INSURANCE | Attending: Family Medicine | Primary: Family Medicine

## 2019-07-02 ENCOUNTER — Encounter

## 2019-07-02 ENCOUNTER — Telehealth: Attending: Family Medicine | Primary: Family Medicine

## 2019-07-02 DIAGNOSIS — N911 Secondary amenorrhea: Secondary | ICD-10-CM

## 2019-07-02 MED ORDER — QUETIAPINE 50 MG TAB
50 mg | ORAL_TABLET | Freq: Every evening | ORAL | 1 refills | Status: AC
Start: 2019-07-02 — End: ?

## 2019-07-02 MED ORDER — RIZATRIPTAN 10 MG TAB, RAPID DISSOLVE
10 mg | ORAL_TABLET | ORAL | 3 refills | Status: AC
Start: 2019-07-02 — End: ?

## 2019-07-02 NOTE — Progress Notes (Signed)
Diana Salazar is a 43 y.o. female who was seen by synchronous (real-time) audio-video technology using [email or web address] on 07/02/2019.      Mr#: [id number]    Consent:  She and/or her healthcare decision maker is aware that this patient-initiated Telehealth encounter is a billable service, with coverage as determined by her insurance carrier. She is aware that she may receive a bill and has provided verbal consent to proceed: YES    I was in the office while conducting this encounter.      712  HPI/Subjective:     She presents to establish care and reports concern about missed menses for over 2 months, states that she has had bilateral salpingectomies because of previous ectopic pregnancies and denies menopausal symptoms  Also reports recent history of pain with intercourse and concern about exposure to STDs.  She reports a recurrence of headaches reminiscent of migraine headaches she had in the past although these are less severe and seem to occur daily.  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  She was previously treated with Trileptal 300 mg daily at bedtime Lexapro 10 mg daily Cogentin 1 mg twice daily and Seroquel 200 mg daily at bedtime  She is a current cigarette smoker    Patient Active Problem List   Diagnosis Code   ??? Vulvar cancer (HCC) C51.9            Allergies   Allergen Reactions   ??? Sulfa (Sulfonamide Antibiotics) Nausea and Vomiting       No current outpatient medications on file.      Review of Systems   Constitutional: Negative for chills, fever and weight loss.   HENT: Negative for congestion, hearing loss and sore throat.    Eyes: Negative for blurred vision and double vision.   Respiratory: Positive for cough (smoker's cough). Negative for shortness of breath and wheezing.     Cardiovascular: Negative for chest pain, palpitations and leg swelling.   Gastrointestinal: Negative for abdominal pain, blood in stool, constipation, diarrhea, heartburn, melena, nausea and vomiting.   Genitourinary: Negative for dysuria and urgency.        LMP 04/13/2019  Increased discharge, pain with intercourse   Musculoskeletal: Positive for back pain (right sided sciatica, episodic). Negative for joint pain and myalgias.   Skin: Negative for itching and rash.   Neurological: Positive for headaches ( daily headaches, ? migraine). Negative for dizziness, tingling, sensory change and focal weakness.   Endo/Heme/Allergies: Negative for environmental allergies.   Psychiatric/Behavioral: Negative for depression. The patient has insomnia. The patient is not nervous/anxious.          PHYSICAL EXAMINATION:    Constitutional: [x]  Appears well-developed and well-nourished [x]  No apparent distress        Mental status: [x]  Alert and awake  [x]  Oriented t [x]  Able to follow commands      Eyes:   EOM    [x]   Normal        HENT: [x]  Normocephalic,      Pulmonary/Chest: [x]  Respiratory effort normal   [x]  No visualized signs of difficulty breathing or respiratory distress           Musculoskeletal:   [x]  No obvious deformities noted with regard to areas viewed           Neurological:        [  x] No apparent neurologic deficits noted in areas viewed                 Skin:        [x]  No apparent dermatologic abnormalities noted in areas viewed               Psychiatric:       [x]  Normal Affect      Other pertinent observable physical exam findings:-None noted          Assessment & Plan:   Diagnoses and all orders for this visit:    1. Secondary amenorrhea  -     CBC WITH AUTOMATED DIFF; Future  -     METABOLIC PANEL, BASIC; Future  -     TSH 3RD GENERATION; Future  -     FSH AND LH; Future    2. 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 Encounter for lipid screening for cardiovascular disease  -     LIPID PANEL; Future    5. Screen for STD (sexually transmitted disease)  -     CHLAMYDIA/NEISSERIA AMPLIFICATION; Future  -     HIV 1/2 AG/AB, 4TH GENERATION,W RFLX CONFIRM; Future  -     T PALLIDUM AB; Future    6. Current smoker    7. Chronic insomnia  -     QUEtiapine (SEROquel) 50 mg tablet; Take 1 Tab by mouth nightly.    Schedule a lab appointment for lab studies, fasting not required, further disposition pending lab results  Resume Seroquel beginning at 50 mg daily at bedtime  Begin rizatriptan 10 mg at the first sign of headache which may be repeated in 2 hours but do not exceed 2 doses in 24 hours.  Schedule telemedicine follow-up appointment in 2 weeks, sooner with any problems      I advised her to contact the office if her condition worsens, changes, fails to improve as anticipated and with any new problems. She expressed understanding with the diagnosis(es) and plan.          This service was provided throughTelehealth, the patient is at home and the provider is at [facility] and Lorrine Kin, [place] to the emergency declaration under the [place], [place] waiver authority and the [place] and [employer] Act, this Virtual  Visit was conducted, with patient's consent, to reduce the patient's risk of exposure to COVID-19 and provide continuity of care for an established patient.     Services were provided through a video synchronous discussion virtually to substitute for in-person clinic visit.     Lenice Pressman, MD         PLEASE NOTE:   This document has been produced using voice recognition software. Unrecognized errors in transcription may be present.

## 2019-07-02 NOTE — Patient Instructions (Signed)
Schedule a lab appointment for lab studies, fasting not required, further disposition pending lab results  Resume Seroquel beginning at 50 mg daily at bedtime  Begin rizatriptan 10 mg at the first sign of headache which may be repeated in 2 hours but do not exceed 2 doses in 24 hours.  Schedule telemedicine follow-up appointment in 2 weeks, sooner with any problems

## 2019-07-02 NOTE — Progress Notes (Signed)
Diana Salazar is a 43 y.o. female who was seen by synchronous (real-time) audio-video technology using [email or web address] on 07/02/2019.      Mr#: [id number]    Consent:  She and/or her healthcare decision maker is aware that this patient-initiated Telehealth encounter is a billable service, with coverage as determined by her insurance carrier. She is aware that she may receive a bill and has provided verbal consent to proceed: YES    I was in the office while conducting this encounter.      712  HPI/Subjective:     She presents to establish care and reports concern about missed menses for over 2 months, states that she has had bilateral salpingectomies because of previous ectopic pregnancies and denies menopausal symptoms  Also reports recent history of pain with intercourse and concern about exposure to STDs.  She reports a recurrence of headaches reminiscent of migraine headaches she had in the past although these are less severe and seem to occur daily.  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  She was previously treated with Trileptal 300 mg daily at bedtime Lexapro 10 mg daily Cogentin 1 mg twice daily and Seroquel 200 mg daily at bedtime  She is a current cigarette smoker    Patient Active Problem List   Diagnosis Code   ??? Vulvar cancer (HCC) C51.9            Allergies   Allergen Reactions   ??? Sulfa (Sulfonamide Antibiotics) Nausea and Vomiting       No current outpatient medications on file.      Review of Systems   Constitutional: Negative for chills, fever and weight loss.   HENT: Negative for congestion, hearing loss and sore throat.    Eyes: Negative for blurred vision and double vision.   Respiratory: Positive for cough (smoker's cough). Negative for shortness of breath and wheezing.    Cardiovascular: Negative for chest pain, palpitations and leg  swelling.   Gastrointestinal: Negative for abdominal pain, blood in stool, constipation, diarrhea, heartburn, melena, nausea and vomiting.   Genitourinary: Negative for dysuria and urgency.        LMP 04/13/2019  Increased discharge, pain with intercourse   Musculoskeletal: Positive for back pain (right sided sciatica, episodic). Negative for joint pain and myalgias.   Skin: Negative for itching and rash.   Neurological: Positive for headaches ( daily headaches, ? migraine). Negative for dizziness, tingling, sensory change and focal weakness.   Endo/Heme/Allergies: Negative for environmental allergies.   Psychiatric/Behavioral: Negative for depression. The patient has insomnia. The patient is not nervous/anxious.          PHYSICAL EXAMINATION:    Constitutional: [x]  Appears well-developed and well-nourished [x]  No apparent distress        Mental status: [x]  Alert and awake  [x]  Oriented t [x]  Able to follow commands      Eyes:   EOM    [x]   Normal        HENT: [x]  Normocephalic,      Pulmonary/Chest: [x]  Respiratory effort normal   [x]  No visualized signs of difficulty breathing or respiratory distress           Musculoskeletal:   [x]  No obvious deformities noted with regard to areas viewed           Neurological:        [  x] No apparent neurologic deficits noted in areas viewed                 Skin:        [x]  No apparent dermatologic abnormalities noted in areas viewed               Psychiatric:       [x]  Normal Affect      Other pertinent observable physical exam findings:-None noted          Assessment & Plan:   Diagnoses and all orders for this visit:    1. Secondary amenorrhea  -     CBC WITH AUTOMATED DIFF; Future  -     METABOLIC PANEL, BASIC; Future  -     TSH 3RD GENERATION; Future  -     FSH AND LH; Future    2. Migraine without aura and without status migrainosus, not intractable  -     rizatriptan (MAXALT-MLT) 10 mg disintegrating tablet; Dissolve 1 tablet in mouth at first sign of headache, may repeat  dose in 2 hours if needed, do not exceed two tablets in 24 hours    3. Multiple personality disorder (HCC)  -     METABOLIC PANEL, BASIC; Future  -     TSH 3RD GENERATION; Future  -     REFERRAL TO PSYCHIATRY    4. Encounter for lipid screening for cardiovascular disease  -     LIPID PANEL; Future    5. Screen for STD (sexually transmitted disease)  -     CHLAMYDIA/NEISSERIA AMPLIFICATION; Future  -     HIV 1/2 AG/AB, 4TH GENERATION,W RFLX CONFIRM; Future  -     T PALLIDUM AB; Future    6. Current smoker    7. Chronic insomnia  -     QUEtiapine (SEROquel) 50 mg tablet; Take 1 Tab by mouth nightly.    Schedule a lab appointment for lab studies, fasting not required, further disposition pending lab results  Resume Seroquel beginning at 50 mg daily at bedtime  Begin rizatriptan 10 mg at the first sign of headache which may be repeated in 2 hours but do not exceed 2 doses in 24 hours.  Schedule telemedicine follow-up appointment in 2 weeks, sooner with any problems      I advised her to contact the office if her condition worsens, changes, fails to improve as anticipated and with any new problems. She expressed understanding with the diagnosis(es) and plan.          This service was provided throughTelehealth, the patient is at home and the provider is at [facility] and Golden Hurter, LPN/    Pursuant to the emergency declaration under the [place] Act and the [employer], 1135 waiver authority and the Coronavirus Preparedness and [employer] Act, this Virtual  Visit was conducted, with patient's consent, to reduce the patient's risk of exposure to COVID-19 and provide continuity of care for an established patient.     Services were provided through a video synchronous discussion virtually to substitute for in-person clinic visit.    Dayton Bailiff, MD         PLEASE NOTE:   This document has been produced using voice recognition software. Unrecognized errors  in transcription may be present.

## 2019-11-16 ENCOUNTER — Encounter: Payer: Self-pay | Admitting: Emergency Medicine

## 2019-11-16 ENCOUNTER — Emergency Department: Payer: Medicaid - Out of State

## 2019-11-16 ENCOUNTER — Emergency Department
Admission: EM | Admit: 2019-11-16 | Discharge: 2019-11-16 | Disposition: A | Discharge status: Home / Self Care | Payer: Medicaid - Out of State | Attending: Student | Admitting: Student

## 2019-11-16 ENCOUNTER — Other Ambulatory Visit: Payer: Self-pay

## 2019-11-16 DIAGNOSIS — F17210 Nicotine dependence, cigarettes, uncomplicated: Secondary | ICD-10-CM | POA: Diagnosis not present

## 2019-11-16 DIAGNOSIS — Z8544 Personal history of malignant neoplasm of other female genital organs: Secondary | ICD-10-CM | POA: Diagnosis not present

## 2019-11-16 DIAGNOSIS — J069 Acute upper respiratory infection, unspecified: Secondary | ICD-10-CM

## 2019-11-16 DIAGNOSIS — Z20822 Contact with and (suspected) exposure to covid-19: Secondary | ICD-10-CM

## 2019-11-16 DIAGNOSIS — Z20828 Contact with and (suspected) exposure to other viral communicable diseases: Secondary | ICD-10-CM | POA: Diagnosis not present

## 2019-11-16 DIAGNOSIS — R05 Cough: Secondary | ICD-10-CM | POA: Diagnosis present

## 2019-11-16 MED ORDER — AMOXICILLIN 500 MG PO CAPS: 500.0000 mg | ORAL_CAPSULE | Freq: Three times a day (TID) | ORAL | 0 refills | Status: DC

## 2019-11-16 MED ORDER — BENZONATATE 200 MG PO CAPS: 200.0000 mg | ORAL_CAPSULE | Freq: Three times a day (TID) | ORAL | 0 refills | Status: DC | PRN

## 2019-11-16 NOTE — ED Provider Notes (Signed)
[facility] Emergency Department Provider Note  ____________________________________________   First MD Initiated Contact with Patient 11/16/19 1810     (approximate)  I have reviewed the triage vital signs and the nursing notes.   HISTORY  Chief Complaint URI    HPI Gabrielle Watts is a 43 y.o. female presents emergency department with complaint of URI symptoms for 1 week.  States that the cough is productive with some yellow mucus.  Positive smoker.  No fever or chills.  Retains her sense of taste and smell.  Patient states she also just recently moved here.  They drove here and stop multiple times along the way.  So she is unsure of a Covid exposure.  She states her employer is wanting her to get a Covid test.    Past Medical History:  Diagnosis Date  . Cancer ([place])    Vulvar cancer  . Ectopic pregnancy     There are no active problems to display for this patient.   Past Surgical History:  Procedure Laterality Date  . BILATERAL SALPINGECTOMY    . CATARACT EXTRACTION    . CESAREAN SECTION    . VULVA SURGERY      Prior to Admission medications   Medication Sig Start Date End Date Taking? Authorizing Provider  amoxicillin (AMOXIL) 500 MG capsule Take 1 capsule (500 mg total) by mouth 3 (three) times daily. 11/16/19   Shandell Jallow, Linden Dolin, PA-C  benzonatate (TESSALON) 200 MG capsule Take 1 capsule (200 mg total) by mouth 3 (three) times daily as needed for cough. 11/16/19   Versie Starks, PA-C    Allergies Sulfa antibiotics  No family history on file.  Social History Social History   Tobacco Use  . Smoking status: Current Every Day Smoker    Packs/day: 0.50    Types: Cigarettes  . Smokeless tobacco: Never Used  Substance Use Topics  . Alcohol use: Yes    Comment: occasional  . Drug use: No    Review of Systems  Constitutional: No fever/chills Eyes: No visual changes. ENT: No sore throat. Respiratory: Positive cough  Genitourinary: Negative for dysuria. Musculoskeletal: Negative for back pain. Skin: Negative for rash.    ____________________________________________   PHYSICAL EXAM:  VITAL SIGNS: ED Triage Vitals  Enc Vitals Group     BP 11/16/19 1742 (!) 143/79     Pulse Rate 11/16/19 1742 65     Resp 11/16/19 1742 16     Temp 11/16/19 1742 98.4 F (36.9 C)     Temp Source 11/16/19 1742 Oral     SpO2 11/16/19 1742 99 %     Weight 11/16/19 1743 250 lb (113.4 kg)     Height 11/16/19 1743 5\' 4"  (1.626 m)     Head Circumference --      Peak Flow --      Pain Score 11/16/19 1745 0     Pain Loc --      Pain Edu? --      Excl. in [place]? --     Constitutional: Alert and oriented. Well appearing and in no acute distress. Eyes: Conjunctivae are normal.  Head: Atraumatic. Nose: No congestion/rhinnorhea. Mouth/Throat: Mucous membranes are moist.   Neck:  supple no lymphadenopathy noted Cardiovascular: Normal rate, regular rhythm. Heart sounds are normal Respiratory: Normal respiratory effort.  No retractions, lungs c t a  GU: deferred Musculoskeletal: FROM all extremities, warm and well perfused Neurologic:  Normal speech and language.  Skin:  Skin  is warm, dry and intact. No rash noted. Psychiatric: Mood and affect are normal. Speech and behavior are normal.  ____________________________________________   LABS (all labs ordered are listed, but only abnormal results are displayed)  Labs Reviewed  SARS CORONAVIRUS 2 (TAT 6-24 HRS)   ____________________________________________   ____________________________________________  RADIOLOGY  Chest x-ray  ____________________________________________   PROCEDURES  Procedure(s) performed: No  Procedures    ____________________________________________   INITIAL IMPRESSION / ASSESSMENT AND PLAN / ED COURSE  Pertinent labs & imaging results that were available during my care of the patient were reviewed by me and considered in my  medical decision making (see chart for details).   Patient is 43 year old female presents emergency department with URI and questionable Covid symptoms.  Physical exam shows patient appears well.  Vitals normal.  Lungs clear to all station.  Main exam is unremarkable  Chest x-ray Covid test  ----------------------------------------- 7:02 PM on 11/16/2019 -----------------------------------------  Chest x-ray is negative.   Findings to the patient.  Prescription prescription for [employer]  If she has a negative Covid test she may take the amoxicillin.  If positive she should not take the medication should remain quarantined for an additional 10 days.  Work note was provided.  She is discharged stable condition.    Gabrielle Watts was evaluated in Emergency Department on 11/16/2019 for the symptoms described in the history of present illness. She was evaluated in the context of the global COVID-19 pandemic, which necessitated consideration that the patient might be at risk for infection with the SARS-CoV-2 virus that causes COVID-19. Institutional protocols and algorithms that pertain to the evaluation of patients at risk for COVID-19 are in a state of rapid change based on information released by regulatory bodies including the CDC and federal and state organizations. These policies and algorithms were followed during the patient's care in the ED.   As part of my medical decision making, I reviewed the following data within the [place] notes reviewed and incorporated, Old chart reviewed, Radiograph reviewed chest x-ray is negative, Notes from prior ED visits and [place] Controlled Substance Database  ____________________________________________   FINAL CLINICAL IMPRESSION(S) / ED DIAGNOSES  Final diagnoses:  Acute URI  Suspected COVID-19 virus infection      NEW MEDICATIONS STARTED DURING THIS VISIT:  New Prescriptions   AMOXICILLIN (AMOXIL) 500  MG CAPSULE    Take 1 capsule (500 mg total) by mouth 3 (three) times daily.   BENZONATATE (TESSALON) 200 MG CAPSULE    Take 1 capsule (200 mg total) by mouth 3 (three) times daily as needed for cough.     Note:  This document was prepared using Dragon voice recognition software and may include unintentional dictation errors.    Versie Starks, PA-C 11/16/19 1903    Lilia Pro., MD 11/17/19 [phone]

## 2019-11-16 NOTE — ED Triage Notes (Signed)
Pt to ED via POV c/o URI sx's x 1 week. Pt is in NAD

## 2019-11-16 NOTE — ED Notes (Signed)
See triage note  Presents with cough and SOB   sxs' started about 1 week ago  Unsure of fever but is afebrile on arrival

## 2019-11-16 NOTE — ED Notes (Signed)
See triage note  Presents with cough and some SOB  States sxs; started about 1 week ago  Afebrile on arrival

## 2019-11-16 NOTE — Discharge Instructions (Signed)
Follow-up with acute care if not improving in 3 days.  Return emergency department worsening.  He should remain out of work until your Covid test has resulted.  If negative you may return to work next day.  If positive you must remain out of work for an additional 10 days.

## 2019-11-17 LAB — SARS CORONAVIRUS 2 (TAT 6-24 HRS): SARS Coronavirus 2: NEGATIVE

## 2019-11-26 ENCOUNTER — Encounter: Payer: Self-pay | Admitting: Emergency Medicine

## 2019-11-26 DIAGNOSIS — Z5321 Procedure and treatment not carried out due to patient leaving prior to being seen by health care provider: Secondary | ICD-10-CM | POA: Diagnosis not present

## 2019-11-26 DIAGNOSIS — M79606 Pain in leg, unspecified: Secondary | ICD-10-CM | POA: Insufficient documentation

## 2019-11-26 NOTE — ED Triage Notes (Addendum)
Pt c/o left posterior upper thigh pain x1 month. Pt reports worse when she tries to sit down. Pt denies injury.

## 2019-11-27 ENCOUNTER — Emergency Department
Admission: EM | Admit: 2019-11-27 | Discharge: 2019-11-27 | Disposition: A | Discharge status: Home / Self Care | Payer: Medicaid - Out of State | Attending: Emergency Medicine | Admitting: Emergency Medicine

## 2019-11-29 ENCOUNTER — Emergency Department
Admission: EM | Admit: 2019-11-29 | Discharge: 2019-11-29 | Disposition: A | Discharge status: Home / Self Care | Payer: Medicaid - Out of State | Attending: Emergency Medicine | Admitting: Emergency Medicine

## 2019-11-29 ENCOUNTER — Other Ambulatory Visit: Payer: Self-pay

## 2019-11-29 ENCOUNTER — Encounter: Payer: Self-pay | Admitting: Emergency Medicine

## 2019-11-29 DIAGNOSIS — M545 Low back pain: Secondary | ICD-10-CM | POA: Diagnosis not present

## 2019-11-29 DIAGNOSIS — F17210 Nicotine dependence, cigarettes, uncomplicated: Secondary | ICD-10-CM | POA: Diagnosis not present

## 2019-11-29 DIAGNOSIS — M5416 Radiculopathy, lumbar region: Secondary | ICD-10-CM

## 2019-11-29 DIAGNOSIS — Z8544 Personal history of malignant neoplasm of other female genital organs: Secondary | ICD-10-CM | POA: Diagnosis not present

## 2019-11-29 DIAGNOSIS — M79605 Pain in left leg: Secondary | ICD-10-CM | POA: Diagnosis present

## 2019-11-29 MED ORDER — LIDOCAINE 5 % EX PTCH: 1.0000 | MEDICATED_PATCH | CUTANEOUS | 0 refills | Status: AC

## 2019-11-29 MED ORDER — IBUPROFEN 600 MG PO TABS: 600.0000 mg | ORAL_TABLET | Freq: Four times a day (QID) | ORAL | 0 refills | Status: DC | PRN

## 2019-11-29 MED ORDER — KETOROLAC TROMETHAMINE 30 MG/ML IJ SOLN
30.0000 mg | Freq: Once | INTRAMUSCULAR | Status: AC
  Administered 2019-11-29: 30 mg via INTRAMUSCULAR
  Filled 2019-11-29: qty 1

## 2019-11-29 MED ORDER — CYCLOBENZAPRINE HCL 5 MG PO TABS: ORAL_TABLET | ORAL | 0 refills | Status: DC

## 2019-11-29 NOTE — ED Triage Notes (Signed)
C/O left posterior upper thigh pain x 1 month.  States pain shoots down to ankle.  AAOx3.  Skin warm and dry. NAD.  Ambulates with easy and steady gait.

## 2019-11-29 NOTE — ED Notes (Signed)
Pt states started with back pain and now more noted in left leg.  States feels like a cramp that will not go away.

## 2019-11-29 NOTE — ED Provider Notes (Signed)
[facility] Emergency Department Provider Note  ____________________________________________  Time seen: Approximately 4:44 PM  I have reviewed the triage vital signs and the nursing notes.   HISTORY  Chief Complaint Leg Pain    HPI Gabrielle Watts is a 43 y.o. female that presents to the emergency department for evaluation of left posterior leg pain for 1.5 months.  Patient states that pain started following an episode of low back pain. Pain starts in the center of her left buttocks and goes down her left leg.  Pain is primarily to the back of her right thigh.  She describes pain like a charley horse.   No bowel or bladder dysfunction or saddle anesthesias. Prednisone "makes her mean." No fevers, abdominal pain, dysuria.   Past Medical History:  Diagnosis Date  . Cancer ([place])    Vulvar cancer  . Ectopic pregnancy     There are no active problems to display for this patient.   Past Surgical History:  Procedure Laterality Date  . BILATERAL SALPINGECTOMY    . CATARACT EXTRACTION    . CESAREAN SECTION    . VULVA SURGERY      Prior to Admission medications   Medication Sig Start Date End Date Taking? Authorizing Provider  amoxicillin (AMOXIL) 500 MG capsule Take 1 capsule (500 mg total) by mouth 3 (three) times daily. 11/16/19   Fisher, Linden Dolin, PA-C  benzonatate (TESSALON) 200 MG capsule Take 1 capsule (200 mg total) by mouth 3 (three) times daily as needed for cough. 11/16/19   Caryn Section Linden Dolin, PA-C  cyclobenzaprine (FLEXERIL) 5 MG tablet Take 1-2 tablets 3 times daily as needed 11/29/19   Laban Emperor, PA-C  ibuprofen (ADVIL) 600 MG tablet Take 1 tablet (600 mg total) by mouth every 6 (six) hours as needed. 11/29/19   Laban Emperor, PA-C  lidocaine (LIDODERM) 5 % Place 1 patch onto the skin daily. Remove & Discard patch within 12 hours or as directed by MD 11/29/19   Laban Emperor, PA-C    Allergies Sulfa antibiotics  No family history on  file.  Social History Social History   Tobacco Use  . Smoking status: Current Every Day Smoker    Packs/day: 0.50    Types: Cigarettes  . Smokeless tobacco: Never Used  Substance Use Topics  . Alcohol use: Yes    Comment: occasional  . Drug use: No     Review of Systems  Constitutional: No fever/chills Cardiovascular: No chest pain. Respiratory: No cough. No SOB. Gastrointestinal: No abdominal pain.  No nausea, no vomiting.  Musculoskeletal: Positive for leg pain. Skin: Negative for rash, abrasions, lacerations, ecchymosis. Neurological: Negative for headaches   ____________________________________________   PHYSICAL EXAM:  VITAL SIGNS: ED Triage Vitals  Enc Vitals Group     BP 11/29/19 1551 133/80     Pulse Rate 11/29/19 1551 77     Resp 11/29/19 1551 18     Temp 11/29/19 1551 98.4 F (36.9 C)     Temp Source 11/29/19 1551 Oral     SpO2 11/29/19 1551 100 %     Weight 11/29/19 1525 250 lb (113.4 kg)     Height --      Head Circumference --      Peak Flow --      Pain Score 11/29/19 1525 8     Pain Loc --      Pain Edu? --      Excl. in [place]? --  Constitutional: Alert and oriented. Well appearing and in no acute distress. Eyes: Conjunctivae are normal. PERRL. EOMI. Head: Atraumatic. ENT:      Ears:      Nose: No congestion/rhinnorhea.      Mouth/Throat: Mucous membranes are moist.  Neck: No stridor.  Cardiovascular: Normal rate, regular rhythm.  Good peripheral circulation. Respiratory: Normal respiratory effort without tachypnea or retractions. Lungs CTAB. Good air entry to the bases with no decreased or absent breath sounds. Gastrointestinal: Bowel sounds 4 quadrants. Soft and nontender to palpation. No guarding or rigidity. No palpable masses. No distention.  Musculoskeletal: Full range of motion to all extremities. No gross deformities appreciated. Tenderness to palpation to left mid buttocks. Full ROM of left hip. Strength equal in lower  extremities bilaterally. No calf tenderness. No swelling. Full ROM of all toes. Neurologic:  Normal speech and language. No gross focal neurologic deficits are appreciated.  Skin:  Skin is warm, dry and intact. No rash noted. Psychiatric: Mood and affect are normal. Speech and behavior are normal. Patient exhibits appropriate insight and judgement.   ____________________________________________   LABS (all labs ordered are listed, but only abnormal results are displayed)  Labs Reviewed - No data to display ____________________________________________  EKG   ____________________________________________  RADIOLOGY   No results found.  ____________________________________________    PROCEDURES  Procedure(s) performed:    Procedures    Medications  ketorolac (TORADOL) 30 MG/ML injection 30 mg (30 mg Intramuscular Given 11/29/19 1838)     ____________________________________________   INITIAL IMPRESSION / ASSESSMENT AND PLAN / ED COURSE  Pertinent labs & imaging results that were available during my care of the patient were reviewed by me and considered in my medical decision making (see chart for details).  Review of the [place] CSRS was performed in accordance of the [place] prior to dispensing any controlled drugs.   Patient's diagnosis is consistent with sciatica. Patient will be discharged home with prescriptions for . Patient is to follow up with primary care and neurosurgery as directed. Referral was given to Dr. Cari Caraway. Patient is given ED precautions to return to the ED for any worsening or new symptoms.  Gabrielle Watts was evaluated in Emergency Department on 11/29/2019 for the symptoms described in the history of present illness. She was evaluated in the context of the global COVID-19 pandemic, which necessitated consideration that the patient might be at risk for infection with the SARS-CoV-2 virus that causes COVID-19. Institutional protocols and  algorithms that pertain to the evaluation of patients at risk for COVID-19 are in a state of rapid change based on information released by regulatory bodies including the CDC and federal and state organizations. These policies and algorithms were followed during the patient's care in the ED.   ____________________________________________  FINAL CLINICAL IMPRESSION(S) / ED DIAGNOSES  Final diagnoses:  Lumbar radiculopathy      NEW MEDICATIONS STARTED DURING THIS VISIT:  ED Discharge Orders         Ordered    cyclobenzaprine (FLEXERIL) 5 MG tablet     11/29/19 1811    ibuprofen (ADVIL) 600 MG tablet  Every 6 hours PRN     11/29/19 1811    lidocaine (LIDODERM) 5 %  Every 24 hours     11/29/19 1811              This chart was dictated using voice recognition software/Dragon. Despite best efforts to proofread, errors can occur which can change the meaning. Any change was purely unintentional.  Laban Emperor, PA-C 11/30/19 Larena Glassman    Arta Silence, MD 12/02/19 [phone]

## 2019-12-10 ENCOUNTER — Encounter: Payer: Self-pay | Admitting: Emergency Medicine

## 2019-12-10 ENCOUNTER — Other Ambulatory Visit: Payer: Self-pay

## 2019-12-10 ENCOUNTER — Emergency Department
Admission: EM | Admit: 2019-12-10 | Discharge: 2019-12-10 | Disposition: A | Discharge status: Home / Self Care | Payer: Medicaid - Out of State | Attending: Emergency Medicine | Admitting: Emergency Medicine

## 2019-12-10 DIAGNOSIS — F17210 Nicotine dependence, cigarettes, uncomplicated: Secondary | ICD-10-CM | POA: Diagnosis not present

## 2019-12-10 DIAGNOSIS — M5432 Sciatica, left side: Secondary | ICD-10-CM | POA: Diagnosis not present

## 2019-12-10 DIAGNOSIS — M545 Low back pain: Secondary | ICD-10-CM | POA: Diagnosis present

## 2019-12-10 DIAGNOSIS — Z8544 Personal history of malignant neoplasm of other female genital organs: Secondary | ICD-10-CM | POA: Insufficient documentation

## 2019-12-10 MED ORDER — NABUMETONE 500 MG PO TABS: 500.0000 mg | ORAL_TABLET | Freq: Every day | ORAL | 0 refills | Status: AC

## 2019-12-10 MED ORDER — KETOROLAC TROMETHAMINE 30 MG/ML IJ SOLN
30.0000 mg | Freq: Once | INTRAMUSCULAR | Status: AC
  Administered 2019-12-10: 30 mg via INTRAMUSCULAR
  Filled 2019-12-10: qty 1

## 2019-12-10 NOTE — ED Provider Notes (Signed)
[facility] Emergency Department Provider Note   ____________________________________________   First MD Initiated Contact with Patient 12/10/19 1206     (approximate)  I have reviewed the triage vital signs and the nursing notes.   HISTORY  Chief Complaint Back Pain   HPI Gabrielle Watts is a 43 y.o. female presents with complaint of low back pain with radiation down her left leg.  Patient states she was seen here a week ago and was given muscle relaxant which actually made the pain worse.  Patient denies any saddle anesthesias or incontinence of bowel or bladder.  She denies any prior back injury.  Patient states that pain radiates down to her left calf and at times she has numbness in her foot.  Patient states that she cannot take steroids as they make her extremely angry and she has insomnia.  She rates her pain as an 8 out of 10.      Past Medical History:  Diagnosis Date  . Cancer ([place])    Vulvar cancer  . Ectopic pregnancy     There are no problems to display for this patient.   Past Surgical History:  Procedure Laterality Date  . BILATERAL SALPINGECTOMY    . CATARACT EXTRACTION    . CESAREAN SECTION    . VULVA SURGERY      Prior to Admission medications   Medication Sig Start Date End Date Taking? Authorizing Provider  lidocaine (LIDODERM) 5 % Place 1 patch onto the skin daily. Remove & Discard patch within 12 hours or as directed by MD 11/29/19   Laban Emperor, PA-C  nabumetone (RELAFEN) 500 MG tablet Take 1 tablet (500 mg total) by mouth daily. 12/10/19 12/09/20  Johnn Hai, PA-C    Allergies Sulfa antibiotics  No family history on file.  Social History Social History   Tobacco Use  . Smoking status: Current Every Day Smoker    Packs/day: 0.50    Types: Cigarettes  . Smokeless tobacco: Never Used  Substance Use Topics  . Alcohol use: Yes    Comment: occasional  . Drug use: No    Review of Systems  Constitutional: No fever/chills Cardiovascular: Denies chest pain. Respiratory: Denies shortness of breath. Gastrointestinal: No abdominal pain.  No nausea, no vomiting.  Genitourinary: Negative for dysuria. Musculoskeletal: Left leg radiculopathy. Skin: Negative for rash. Neurological: Negative for headaches, focal weakness or numbness. ____________________________________________   PHYSICAL EXAM:  VITAL SIGNS: ED Triage Vitals  Enc Vitals Group     BP 12/10/19 1145 (!) 148/76     Pulse Rate 12/10/19 1145 76     Resp 12/10/19 1145 16     Temp --      Temp src --      SpO2 12/10/19 1145 100 %     Weight 12/10/19 1055 252 lb (114.3 kg)     Height 12/10/19 1055 5\' 4"  (1.626 m)     Head Circumference --      Peak Flow --      Pain Score 12/10/19 1055 8     Pain Loc --      Pain Edu? --      Excl. in [place]? --     Constitutional: Alert and oriented. Well appearing and in no acute distress. Eyes: Conjunctivae are normal.  Head: Atraumatic. Neck: No stridor.   Cardiovascular: Normal rate, regular rhythm. Grossly normal heart sounds.  Good peripheral circulation. Respiratory: Normal respiratory effort.  No retractions. Lungs CTAB. Gastrointestinal: Soft  and nontender. No distention.  Musculoskeletal: No point tenderness is noted on palpation of the lower lumbar spine.  Minimal tenderness to the SI joint and surrounding tissue however patient begins having some discomfort with palpation just below the left buttocks.  Good muscle strength bilaterally.  Straight leg raises were restricted at approximately 30 degrees on the left. Neurologic:  Normal speech and language. No gross focal neurologic deficits are appreciated.  Reflexes were 2+ bilaterally.  No gait instability. Skin:  Skin is warm, dry and intact. No rash noted. Psychiatric: Mood and affect are normal. Speech and behavior are normal.  ____________________________________________   LABS (all labs ordered are listed, but  only abnormal results are displayed)  Labs Reviewed - No data to display  PROCEDURES  Procedure(s) performed (including Critical Care):  Procedures   ____________________________________________   INITIAL IMPRESSION / ASSESSMENT AND PLAN / ED COURSE  As part of my medical decision making, I reviewed the following data within the electronic MEDICAL RECORD NUMBER Notes from prior ED visits and [place] Controlled Substance Database  43 year old female presents to the ED with continued left leg sciatica.  Patient was seen last week for the same and given muscle relaxants which she states did not help.  We discussed her issues with prednisone and apparently she becomes extremely angry at everything and has severe insomnia.  A prescription for Relafen 500 mg twice daily with food was sent to her pharmacy.  She is encouraged to use ice or heat to her back as needed.  She also is aware that she may need to follow-up with Dr. Mack Guise if any continued problems with her sciatica. ____________________________________________   FINAL CLINICAL IMPRESSION(S) / ED DIAGNOSES  Final diagnoses:  Sciatica of left side     ED Discharge Orders         Ordered    nabumetone (RELAFEN) 500 MG tablet  Daily     12/10/19 1237           Note:  This document was prepared using Dragon voice recognition software and may include unintentional dictation errors.    Johnn Hai, PA-C 12/10/19 1341    Nena Polio, MD 12/10/19 [phone]

## 2019-12-10 NOTE — ED Notes (Addendum)
See triage note  Presents with lower back and leg pain  States was see last week for same

## 2019-12-10 NOTE — ED Triage Notes (Signed)
Pt reports lower back pain that radiates down her left leg. Pt states was seen here a week ago and was given muscle relaxer's but they have made the pain worse.

## 2019-12-10 NOTE — Discharge Instructions (Addendum)
Follow-up with Dr. Mack Guise if any continued problems.  Begin taking etodolac 400 mg twice daily with food.  May continue taking Tylenol with this medication.  Use ice or heat to your back as needed for inflammation and help with discomfort.  This can also be used to your thigh as well.

## 2020-01-16 IMAGING — CR DG ELBOW COMPLETE 3+V*L*
4 series · 4 of 4 positions shown · non-contrast
Comparison: Right elbow 05/01/2018

CLINICAL DATA: 41-year-old with left elbow pain. Bilateral
epicondylitis.

EXAM:
LEFT ELBOW - COMPLETE 3+ VIEW

[elbow ap]
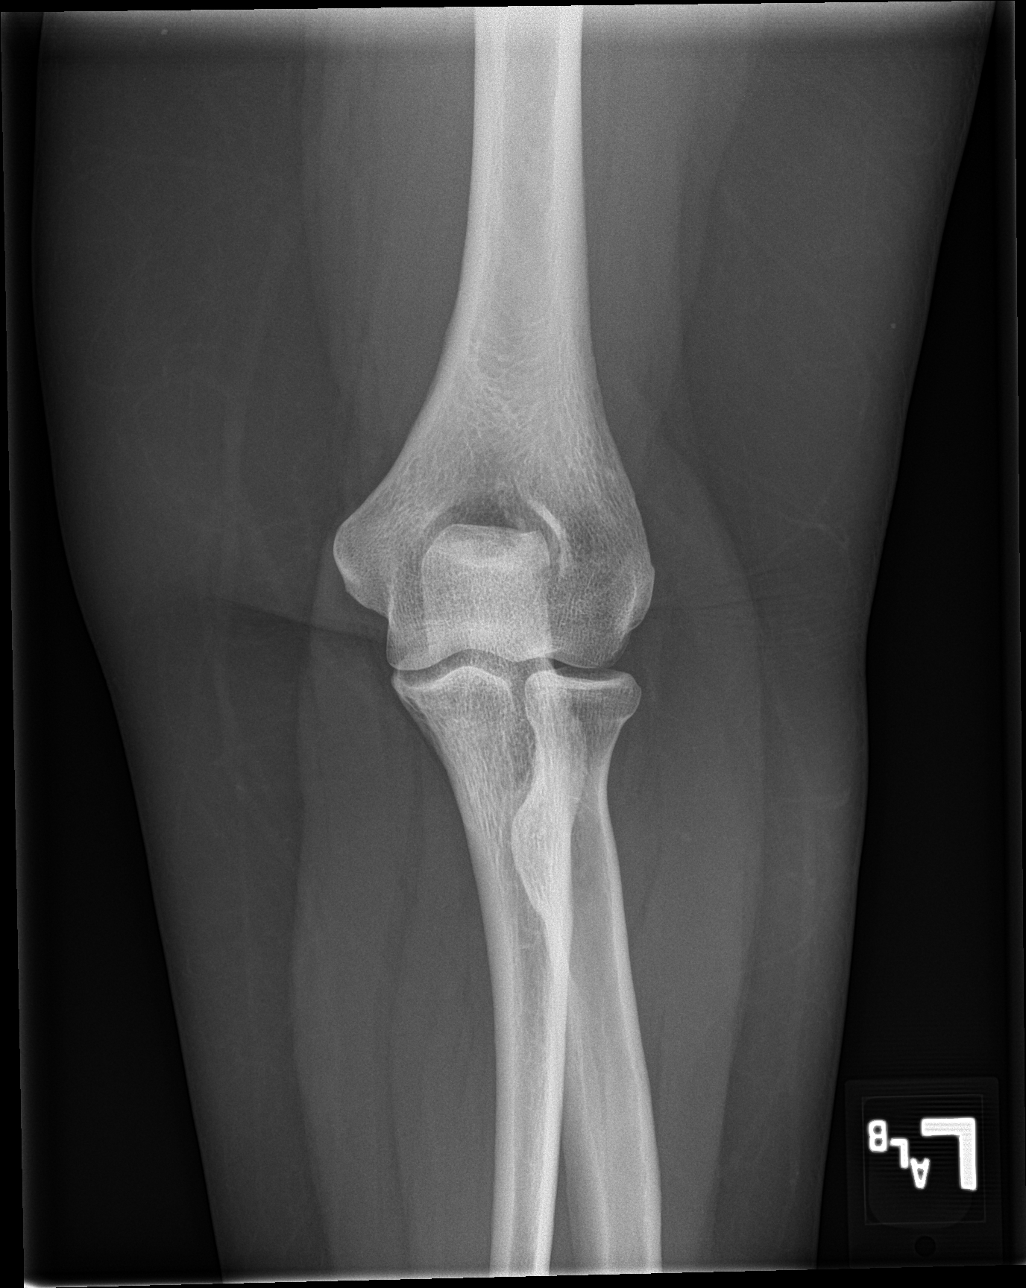

[elbow obl (1 of 2)]
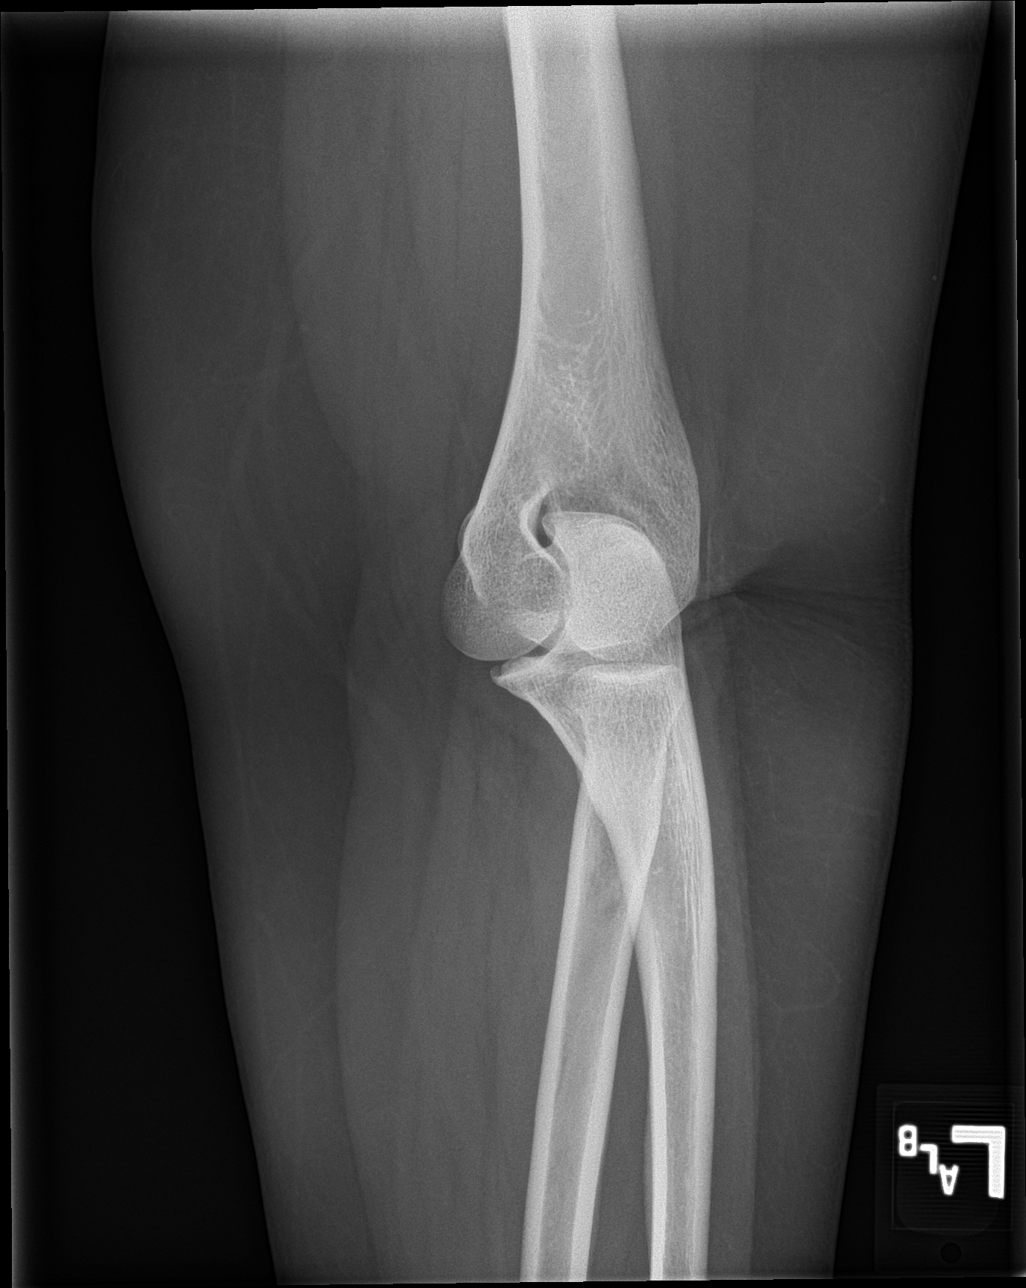

[elbow obl (2 of 2)]
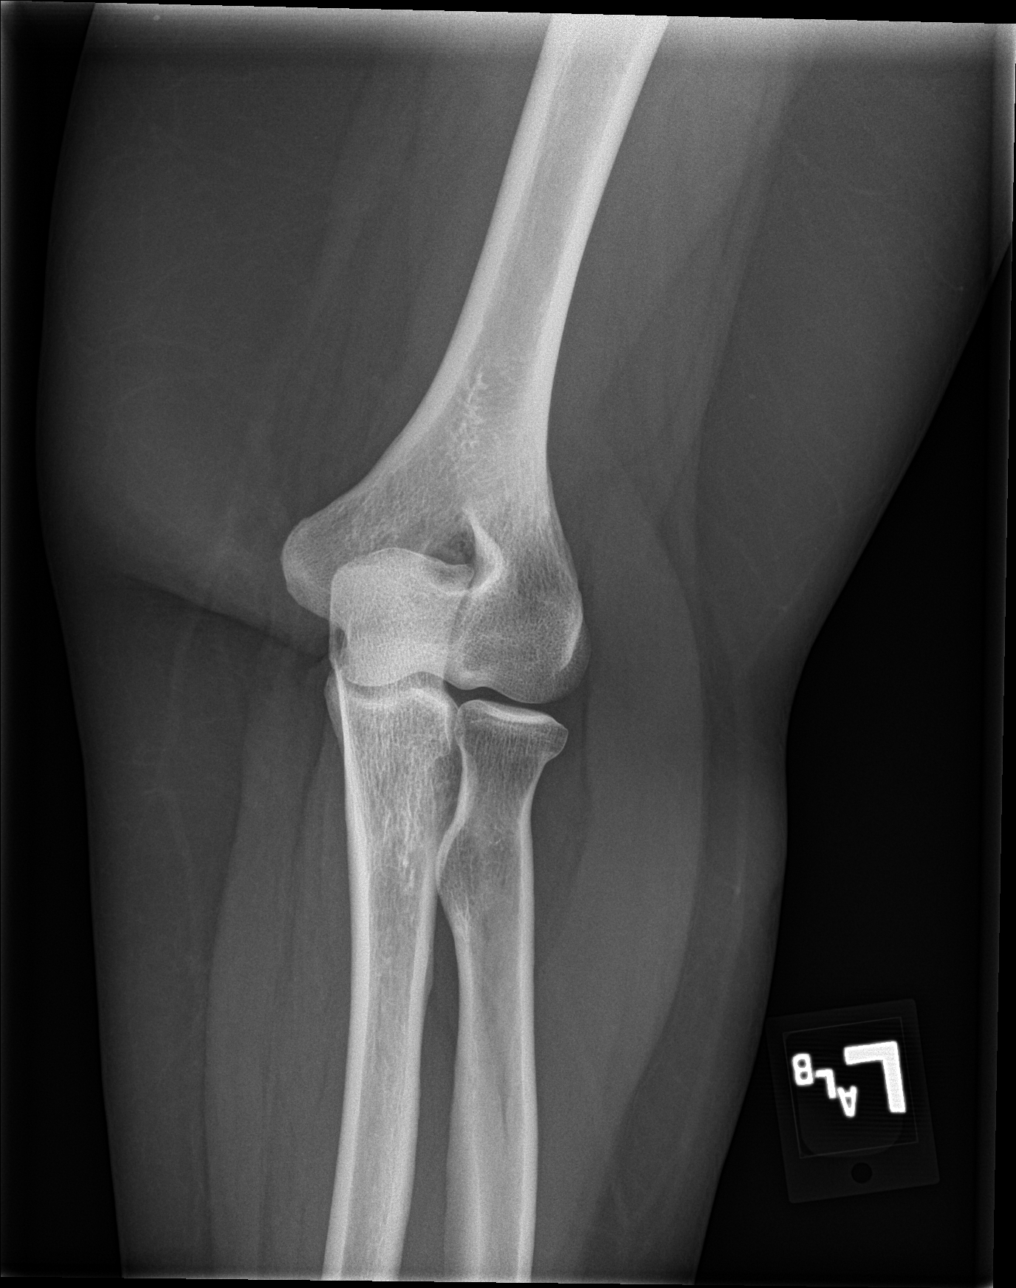

[elbow lat]
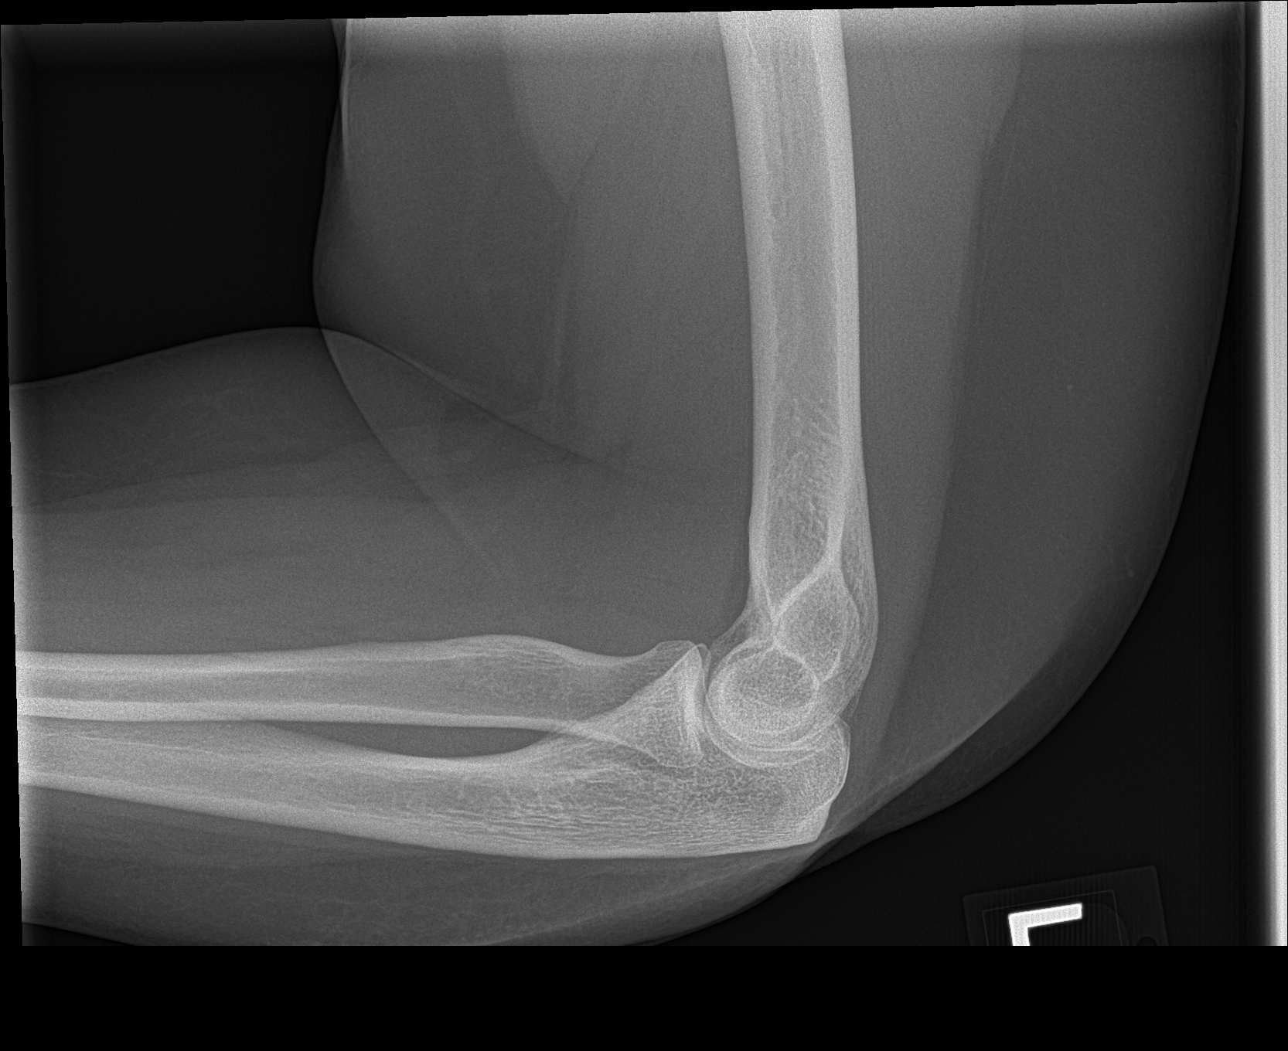

[4 of 4 positions shown; findings below may reference images not displayed]

FINDINGS: Negative for fracture, dislocation or joint effusion. Normal
alignment of the left elbow. Minimal spurring at the coronoid
process of the ulna. No focal soft tissue abnormality.
IMPRESSION: No acute abnormality to the left elbow.

## 2020-08-01 ENCOUNTER — Encounter

## 2020-08-02 NOTE — Telephone Encounter (Signed)
I have only encountered this patient once previously via telemedicine in July 2020 and prescribed this medication for 30 days with 1 refill pending referral for psychiatry evaluation

## 2020-08-04 NOTE — Telephone Encounter (Signed)
Attempted to call pt to inform that Rx was not approved because PCP has not seen them since 06/2019, for a VV. Pt is due for labs followed by an in office appt. No refills w/o appts.

## 2021-07-01 NOTE — Progress Notes (Signed)
Formatting of this note is different from the original.  CARE TEAM:  Patient Care Team:  No Pcp as PCP - General (General Practice)    HISTORY OF PRESENT ILLNESS:  Chief Complaint: Pain and Follow-up of the Lower Back    Age: 45 y.o.    Sex: female     History of present illness: [name] presents today for a follow up. They are following up for evaluation of the LUMBAR spine . They were last seen on 06/15/21 by PA-C Betha Loa. At that time, patient noted minimal benefit from the LEFT L4-5, L5-S1 TFESI I administered on 05/02/21. She was referred to Dr. Ernesta Amble for surgical consideration and given a script for Baclofen.     She presents today accompanied and ambulating without external support. The patient reports to have low back pain, and shooting pain down the LEFT lower extremity. Recently she complains of weakness and a lack of mobility throughout her RIGHT lower extremity. She rates her pain  4/10 at today's visit. She is currently attending formal physical therapy at [facility]. She was also given a referral to chronic medication management per Dr. Tobe Sos.     OBJECTIVE:    Lumbar EXAM    Constitutional:  mild distress. Smells of cigarettes.     Gait: Normal gait.    Musculoskeletal:  LUMBAR SPINE EXAM   Palpation:   LEFT Lumbar paraspinals TTP    Range of motion:  PROM is within normal limits   Strength:  BL LE overall MMST 5/5    Special tests:  LEFT SLR positive    BL Facet loading (extension and ipsilateral rotation) Negative      Neurologic:  Sensation:  BL LE all dermatomes grossly LTI    DTR:  BL Patella 2+  BL Achilles 2+    Special tests:  BL Clonus Negative      IMAGING / STUDIES:         No imaging obtained     ASSESSMENT:  1. Lumbar radiculopathy    2. Other low back pain      Patient Active Problem List   Diagnosis    Lumbar radiculopathy    Other low back pain     PLAN:  Treatment Plan:   Orders Placed This Encounter    BP Patient Education    BMI Patient Education     Physical exam  completed.The patient's exam and symptoms are consistent with lumbar radiculopathy and low back pain. Discussion regarding analogue of treatment for current condition to include application of rest, ice, NSAIDs, and physical therapy. We have also discussed the benefits and complications of an interlaminar epidural steroid injection ,  and follow-up care.    Will purse another LESI, but slightly different approach to hopefully get better pain relief.    We will pursue doing a L4-5 ILESI  #2    - Patient has radicular pain radiating to the lower extremities as the result of pathology in the lumbar spine.   - This pain interferes with functional activities including activities of daily living, occupational demands and/or recreational activities.   - Conservative therapy of 6 weeks duration, or longer, including activity modification and a variety of medicines, has failed to provide adequate relief.   - There is no contraindication to epidural injection. There is no indication of systemic or localized infection. Anticoagulation status has been addressed by cessation of all meds with anticoagulant activities. No unstable medical conditions exist.    After discussion  with patient, we have elected to proceed with the following:  -Injections: Will pursue L4-5 ILESI injection under fluoroscopy for symptomatic relief.   - Will give Valium for preprocedure anxiety    Follow-up: Return for For Lumbar ILESI.     PROCEDURES:  Procedures       I, Karolee Stamps, DO, personally, performed the services described in this documentation, as scribed in my presence, and it is both accurate and complete.  Scribed by: Arelia Sneddon     Electronically signed by Karolee Stamps, DO at 07/10/2021  8:42 AM EDT

## 2021-07-12 NOTE — ED Triage Notes (Signed)
Formatting of this note might be different from the original.  Pt CO CP and bilat leg swelling x 2 days.   Electronically signed by Bethel Born, RN at 07/12/2021  7:51 PM EDT

## 2021-07-12 NOTE — ED Notes (Signed)
Formatting of this note might be different from the original.  Pt left without being seen.   Electronically signed by Bethel Born, RN at 07/12/2021 11:10 PM EDT

## 2021-08-02 IMAGING — DX DG CHEST 1V PORT
1 series · 1 of 1 positions shown · non-contrast
Comparison: None.

CLINICAL DATA: Cough

EXAM:
PORTABLE CHEST 1 VIEW

[chest ap]
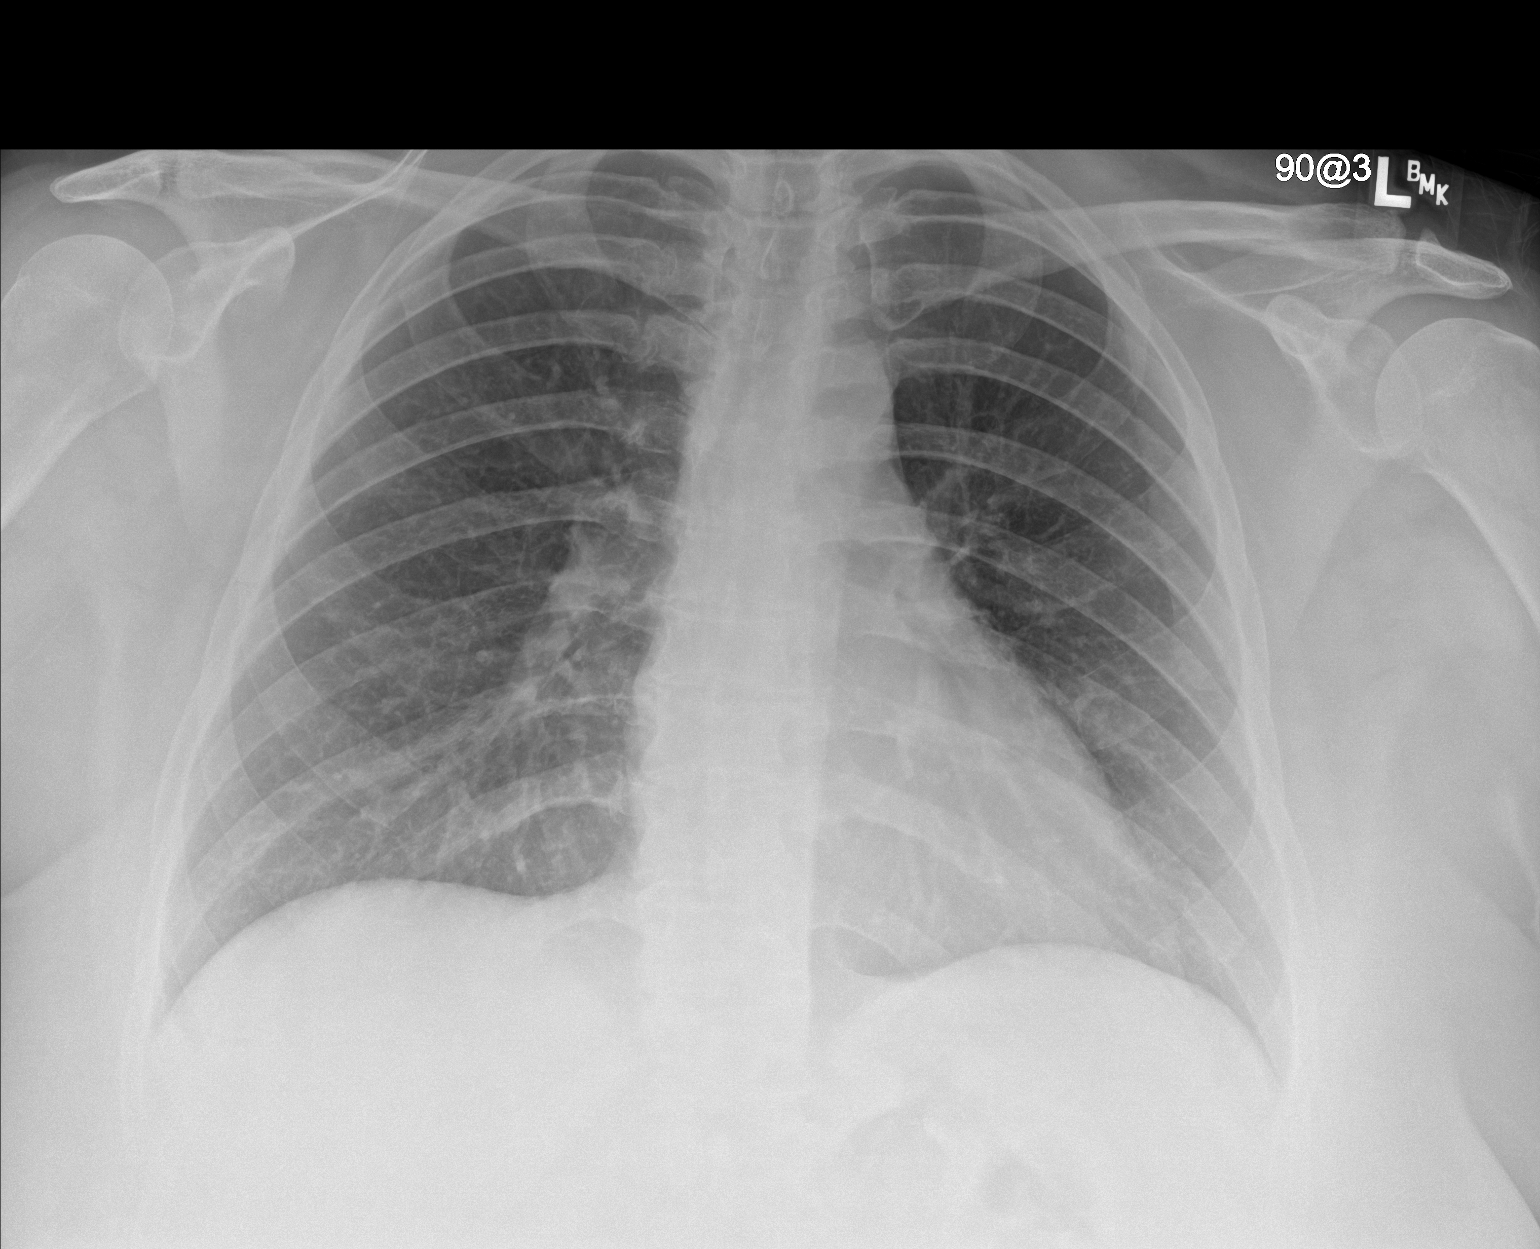

[1 of 1 positions shown; findings below may reference images not displayed]

FINDINGS: Normal heart size. Normal mediastinal contour. No pneumothorax. No
pleural effusion. Lungs appear clear, with no acute consolidative
airspace disease and no pulmonary edema.
IMPRESSION: No active disease.

## 2022-09-06 NOTE — Progress Notes (Signed)
PREOPERATIVE INSTRUCTIONS  Please Read Carefully    '[x]'$  Your procedure is scheduled on: 09/09/22     '[]'$  The day before your surgery, call the surgeon's office to check on the surgery time and the time you should arrive.     '[x]'$  On the day of your surgery, arrive at the time given to you by your surgeon and check in at the first floor registration desk.    '[x]'$  DO NOT eat anything after midnight before your surgery. This includes gum, mints or hard candy.  May have Water, black coffee (NO CREAM) or regular Gatorade (not sugar free) up to 3 hours prior to surgery, but no more than 6 ounces per hour (approx.  cup).    '[x]'$  You may brush your teeth the morning of surgery, however DO NOT swallow any water.    '[x]'$  No smoking after midnight.  Smoking should be reduced a few days prior to surgery.    '[x]'$  If you are having an outpatient procedure, you must have a responsible adult bring you to the hospital.  They must remain in the surgical waiting area for the duration of your procedure and provide you with transportation home.  You may not drive yourself home.  We also recommend that you arrange for a responsible person to stay with you for 24 hours following your procedure.  Failure to comply with these instructions may result in cancellation of the procedure.    '[]'$  A parent or legal guardian must accompany minors to the hospital.  LEGAL GUARDIANS MUST bring custody papers with them the day of surgery.    '[]'$  If the lab gives you a blue blood ID band, do not throw it away.  Bring it with you on the day of surgery.      '[]'$  Please return to preop surgical testing no more than 14 days before surgery date to have your type and screen done prior to surgery.    '[]'$  If you have been diagnosed with Sleep Apnea and are using a CPAP, BiPAP, and/or Bi-Flex machine, please bring it with you the day of surgery.    '[]'$  Endoscopy patients, please follow the instructions provided by the doctors office.    '[]'$  If crutches are  ordered, you must get them and be instructed on proper use before the day of surgery.  Please bring them with you the day of surgery.      PREPARING THE SKIN BEFORE SURGERY Can Reduce the Risk of Infection  '[]'$  You have been given a Chlorhexidine skin preparation packet with instructions to bathe the night before surgery and the morning of surgery prior to arrival.    '[x]'$  If allergic to Chlorhexidine, use Dial (antibacterial) soap to bathe your skin the night before surgery and the morning of surgery.    '[x]'$  Do not shave your face, underarms, legs or any part of your body at least 48 hours before surgery.  Any clipping needed for surgery will be done at the hospital.      [place]  '[x]'$  Wear casual, loose fitting comfortable clothes the day of surgery.    '[x]'$  Remove ALL jewelry, including body piercings.  Leave these and all valuables at home.  Leave suitcases and/or overnight bag at home or in the car for a family member to bring when you get a room.    '[x]'$  DO NOT wear any makeup, nail polish, deodorant, body lotion, aftershave or contact lenses the day  of surgery.  Please bring your glasses and glasses case with you the day of surgery.    _0  Bring any additional paperwork, such as doctors orders, consent form, POA (power of attorney), and/or advanced directives with you the day of surgery.    _1  Please notify your surgeon of any changes in your condition such as fever, sore throat or rash as soon as possible before your surgery date.  After office hours, call your surgeon's answering service.    _2 Bring picture identification and insurance cards with you the day of surgery.      MEDICATIONS:  _3  Stop taking aspirin and aspirin products/NSAIDs (non-steroidal anti-inflammatory drugs such as Motrin, Aleve, Advil, ibuprofen and BC Powder), vitamins and herbal pills 2 weeks before surgery OR as instructed by your doctor.  However, if you take a daily aspirin, please contact your primary care  provider (PCP), for instructions prior to surgery.    _4  Contact your doctor regarding any blood thinner medications you take (such as Coumadin, Plavix, etc.) for instructions about what to do prior to surgery.    _5 If you have diabetes, hold oral diabetic medications the night before surgery and the morning of surgery.  Hold insulin the morning of surgery unless told otherwise by your doctor.    _6  If you have asthma or use inhalers please bring them the day of surgery.    _7 Take the following medications (oral medications with a sip of water)  the day of surgery:       If needed:        Prior to Visit Medications    Not on File        *Visitor Policy-One visitor per patient, must be 2 years of age or older, must wear a mask. Upon check in, the patient must arrive with their post procedure transportation home. The post-procedure transporting party must remain within the surgical waiting room for the duration of the procedure. Failure may result in the cancellation of the procedure*      We want you to have a positive experience at [facility].  If any of these these instructions are not met, it is possible that your surgery will be canceled.  If you have any questions regarding your surgery, please call [facility] at [phone] or your surgeon's office for further assistance.           [facility]    [facility] Anesthesia     There are many ways to perform anesthesia for surgery.  The 2 techniques are generally classified as General Anesthesia and Regional Anesthesia.  While both techniques are very safe, they are distinctly different.  As with any anesthesia, there are risks, which may be increased if you already have heart disease, chronic lung conditions, or other serious medical problems.    General anesthesia puts you to sleep for your surgery.  It acts on your brain and nerves, and affects your entire body.  It may be administered by an injection, or through inhaling medication.  After  you are asleep, a breathing tube may be placed in your windpipe to help you breathe during surgery.  General anesthesia may be more appropriate for longer or more involved surgery, especially if the position you will be in surgery is uncomfortable.  With general anesthesia you may experience a sore throat and hoarse voice for a few days, headache, nausea/vomiting, drowsiness, or blood pressure and breathing problems.    Regional anesthesia involves blocking the nerves to a  specific area of the body with local anesthesia medication ("numbing medicine").  It is usually given in conjunction with varying degrees of twilight sedation.  When at all possible, the recommended type of anesthesia for both total knee replacement (TKA) and total hip replacement (THA) is a regional anesthesia technique.  This can be achieved utilizing a spinal block technique, or third placement of an epidural catheter.  In addition, your anesthesiologist may recommend that you have a peripheral nerve block placed to help with pain after the procedure.    -Spinal block-In a spinal block, local anesthetic (i.e. numbing medicine) is injected into the fluid that baths the spinal cord in the lower part of your back.  This produces a rapid numbing effect that wears off in a few hours.  After meeting the anesthesiologist and discussing her medical conditions and history, the anesthesiologist may decide to place a long-acting pain medicine as well.  There are some medical conditions and home medications that may preclude a spinal block.    -Epidural block-An epidural block uses a catheter inserted into your lower back to deliver numbing medicine.  The epidural block in the spinal block are administered in a very similar location and technique; however, the epidural catheter is placed in a slightly different area around the spine as compared to a spinal block.    -Peripheral nerve block (PNB)-For TKA, the anesthesiologist may discuss performing a PNB.   This technique places local anesthetic directly around the major nerves in your thigh, and one or multiple locations.  These blocks numb only the leg that is injected, and do not affect the other leg.  PNB's are used in addition to another anesthesia technique, and are for postoperative pain relief only.    The advantages of regional anesthesia for joint replacement surgery are considered to be significant.  There is a significant reduction in blood loss and blood transfusions, less nausea/vomiting, less drowsiness, improved pain control after surgery, better diabetes control, and less association with infection.  Additionally, there also are reduced risks (as compared to general anesthesia techniques) with serious medical complications such as heart attack, stroke, pneumonia, respiratory depression, or development of a blood clot in your legs that can go to your lungs.  Like with any technique, there are risks.  With regional anesthesia, there is a low incidence of headache and nerve damage.  Most commonly though, if the regional technique proves challenging, it is a difficulty in getting the numbing medicine in close proximity to the nerves perform the nerve block.  Extremely rarely (1 in 200,000), there can be a collection of blood from around the nerves.  Some patients may experience difficulty voiding (passing urine) for a period of time after a regional technique.

## 2022-09-06 NOTE — Other (Signed)
09/06/22 0929   Pre-AdmissionTesting Checklist   History and Physical Collected? No (Dr. to bring on DOS)   Communication Needs None   Pre-existing DNR Comfort Care/DNR Arrest/DNI Order No   Primary Decision Maker (Healthcare Proxy)   [place] is: Legal Next of [place]   Was the H&P collected? No, doctor will bring paper copy   Are lab results complete? N/A   Are ECG results complete? Yes, in chart   Are procedure consents confirmed? No, doctor will bring paper copy   [place] Dept of Medicaid - Consent on chart for hysterectomy/sterilization procedures? N/A   Anesthesia PAT Clearance   Anesthesia Review Status N/A   Notes HAS SLEEP APNEA DOES NOT USE C-PAP

## 2022-10-04 NOTE — H&P (Unsigned)
Diana Salazar, Diana Salazar DOB: [birth year] (46 yo F) Acc No. [id number] DOS: 08/27/2022  Progress Note  Patient: Diana Salazar, Diana Salazar  Account Number: [id number] Provider: Malon Kindle, MD  DOB: [birth year] Age: 63 Y Sex: Female Date: 08/27/2022  Phone: [phone]  Address: [address], [place], [place]  Pcp: Tye Shidler  Subjective:  Chief Complaints:  1. Disorder of Anus/ Hx of Vulvar Cancer/ [place]. 2. BB1 PE ANORECTAL.  HPI:  History Text:   Referred by Mitzi Hansen due to concerns for a perianal lesion. 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 She has been followed closely since this time. On recent examination,  she was found to have a perianal lesion concerning for condyloma. Patient states that she does get intermittent  irritation and discomfort and does alternate between constipation and diarrhea regularly.  ROS:  General/Constitutional:   Patient denies change in appetite , fatigue.  Respiratory:   Patient denies chest pain , cough.  Cardiovascular:   Patient denies chest pain , dizziness.  Gastrointestinal:   Patient denies abdominal pain , difficulty swallowing.  Genitourinary:   Patient denies blood in the urine , frequent urination.  Musculoskeletal:   Patient denies back problems , joint stiffness , muscle aches.  Neurologic:   Patient denies difficulty speaking , dizziness , loss of strength.  Psychiatric:   Patient denies anxiety , difficulty sleeping.  Medical History:  Surgical History: endometrial ablation , colonoscopy 15 years ago .  Family History: No Family History documented..  Medications: Not-Taking Norethindrone , Not-Taking Neomycin-Polymyxin-HC 3.5-10000-1 Solution 3 drops into  affected ear Otic Three times a day , Not-Taking OXcarbazepine 300 MG , Notes to Pharmacist: 1 po daily, NotTaking QUEtiapine Fumarate 200 MG , Notes to Pharmacist: Take one tablet by mouth qhs, Medication List  reviewed and reconciled with the  patient  Allergies: Sulfa Antibiotics.  Objective:  Vitals: Pulse: 77 /min, BP: 146/82 mm Hg, RR: 15 /min, SPO2: 98 %, Height: 64.00 in, Weight: 284 lbs,  BMI: 48.74 %, Wt-kg: 128.82 kg.  Past Vitals:  02/15/2022  Provider: Malon Kindle, MD Date: 08/27/2022  10/04/22, 7:15 AM Print Preview  2/3  Diana Salazar DOB: October 07, 1976 (46 yo F) Acc No. [id number] DOS: 08/27/2022  Pulse: 89 /min, BP: 153/73 mm Hg, RR: 16 /min, SPO2: 100 %, Temp: 97.1 F, Height: 64.00 in,  Weight: 281 lbs, BMI: 48.23 %, Wt-kg: 127.46 kg  Examination:  General Examination:   GENERAL APPEARANCE: in no acute distress, well developed, well nourished.   HEAD: normocephalic, atraumatic.   EYES: pupils equal, round, reactive to light and accommodation.   NECK/THYROID: neck supple, full range of motion, no cervical lymphadenopathy.   SKIN: no suspicious lesions, warm and dry. No jaundice. No induration.Marland Kitchen   HEART: no murmurs, regular rate and rhythm, S1, S2 normal.   LUNGS: clear to auscultation bilaterally.   ABDOMEN: normal, bowel sounds present, soft, nontender, nondistended.   FEMALE GENITOURINARY: No palpable hernia, defects, or masses. Genitalia normal in appearance. No  fluctuance or erythema noted..   MUSCULOSKELETAL: No deformities. Equal range of motion bilaterally.Marland Kitchen   EXTREMITIES: no clubbing, cyanosis, or edema.   NEUROLOGIC: nonfocal, motor strength normal upper and lower extremities, sensory exam intact.   PSYCH: alert, oriented, cognitive function intact, cooperative with exam.   ... PERIANAL: No fistula. Chronic appearing left posterior anal fissure. No active bleeding. No external  hemorrhoids. No internal hemorrhoids. Right posterior hypertrophic anal papilla and left posterior  small focus of  condyloma..  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 Chronic anal fissure.  2. Anal condyloma.  3. History of vulvar cancer status post excision.  With regard to her  anal condyloma. Recommend we proceed to the operating room electively for an exam under  anesthesia and excision and fulguration. It also gives opportunity to look in the anal canal as well to make sure  there is no disease present at this location. Additionally, for her anal fissure we will treat her medically with  topical nifedipine to be applied to the perianal area 2-3 times per day.  Need 30 minutes. Prone jackknife position. MAC anesthesia. Request Elenor Quinones, Rowan Blase, or Lendon Ka, surgical assistant. No preoperative work-up or bowel prep required.  Plan:  Treatment:  Billing Information:  Visit Code:  92924 Office Visit, New Pt., Level 4.  Procedure Codes:  Provider: Malon Kindle, MD Date: 08/27/2022  10/04/22, 7:15 AM Print Preview  3/3  Diana Salazar DOB: 1976-12-04 (46 yo F) Acc No. [id number] DOS: 08/27/2022  Electronically signed by Malon Kindle , MD on 08/27/2022 at 11:27 AM EDT  Sign off status: Completed  Provider: Malon Kindle, MD Date: 08/27/2022

## 2022-10-04 NOTE — Other (Signed)
PREOPERATIVE INSTRUCTIONS  Please Read Carefully    _0  Your procedure is scheduled on: 10/05/22  _1  The day before your surgery, call the surgeon's office to check on the surgery time and the time you should arrive.     _2  On the day of your surgery, arrive at the time given to you by your surgeon and check in at the first floor registration desk.    _3  DO NOT eat anything after midnight before your surgery. This includes gum, mints or hard candy.  May have Water, black coffee (NO CREAM) or regular Gatorade (not sugar free) up to 3 hours prior to surgery, but no more than 6 ounces per hour (approx.  cup).    _4  You may brush your teeth the morning of surgery, however DO NOT swallow any water.    _5  No smoking after midnight.  Smoking should be reduced a few days prior to surgery.    _6  If you are having an outpatient procedure, you must have a responsible adult bring you to the hospital.  They must remain in the surgical waiting area for the duration of your procedure and provide you with transportation home.  You may not drive yourself home.  We also recommend that you arrange for a responsible person to stay with you for 24 hours following your procedure.  Failure to comply with these instructions may result in cancellation of the procedure.    _7  A parent or legal guardian must accompany minors to the hospital.  LEGAL GUARDIANS MUST bring custody papers with them the day of surgery.    _8  If the lab gives you a blue blood ID band, do not throw it away.  Bring it with you on the day of surgery.      _9  Please return to preop surgical testing no more than 14 days before surgery date to have your type and screen done prior to surgery.    _10  If you have been diagnosed with Sleep Apnea and are using a CPAP, BiPAP, and/or Bi-Flex machine, please bring it with you the day of surgery.    _11  Endoscopy patients, please follow the instructions provided by the doctors office.    _12  If crutches are ordered,  you must get them and be instructed on proper use before the day of surgery.  Please bring them with you the day of surgery.      PREPARING THE SKIN BEFORE SURGERY Can Reduce the Risk of Infection  _13  You have been given a Chlorhexidine skin preparation packet with instructions to bathe the night before surgery and the morning of surgery prior to arrival.    _14  use Dial (antibacterial) soap to bathe your skin the night before surgery and the morning of surgery.    _15  Do not shave your face, underarms, legs or any part of your body at least 48 hours before surgery.  Any clipping needed for surgery will be done at the hospital.      [facility]  _16  Wear casual, loose fitting comfortable clothes the day of surgery.    _17  Remove ALL jewelry, including body piercings.  Leave these and all valuables at home.  Leave suitcases and/or overnight bag at home or in the car for a family member to bring when you get a room.    _18  DO NOT wear any makeup, nail polish, deodorant, body lotion, aftershave or contact lenses the day of surgery.  Please bring your glasses  and glasses case with you the day of surgery.    _0  Bring any additional paperwork, such as doctors orders, consent form, POA (power of attorney), and/or advanced directives with you the day of surgery.    _1  Please notify your surgeon of any changes in your condition such as fever, sore throat or rash as soon as possible before your surgery date.  After office hours, call your surgeon's answering service.    _2 Bring picture identification and insurance cards with you the day of surgery.      MEDICATIONS:  _3  Stop taking aspirin and aspirin products/NSAIDs (non-steroidal anti-inflammatory drugs such as Motrin, Aleve, Advil, ibuprofen and BC Powder), vitamins and herbal pills 2 weeks before surgery OR as instructed by your doctor.  However, if you take a daily aspirin, please contact your primary care provider (PCP), for instructions prior  to surgery.    _4  Contact your doctor regarding any blood thinner medications you take (such as Coumadin, Plavix, etc.) for instructions about what to do prior to surgery.    _5 If you have diabetes, hold oral diabetic medications the night before surgery and the morning of surgery.  Hold insulin the morning of surgery unless told otherwise by your doctor.    _6  If you have asthma or use inhalers please bring them the day of surgery.    _7 Take the following medications (oral medications with a sip of water)  the day of surgery:       If needed:        Prior to Visit Medications    Not on File        *Visitor Policy-One visitor per patient, must be 47 years of age or older, must wear a mask. Upon check in, the patient must arrive with their post procedure transportation home. The post-procedure transporting party must remain within the surgical waiting room for the duration of the procedure. Failure may result in the cancellation of the procedure*      We want you to have a positive experience at [facility].  If any of these these instructions are not met, it is possible that your surgery will be canceled.  If you have any questions regarding your surgery, please call [facility] at [phone] or your surgeon's office for further assistance.           [facility]    [facility] Anesthesia     There are many ways to perform anesthesia for surgery.  The 2 techniques are generally classified as General Anesthesia and Regional Anesthesia.  While both techniques are very safe, they are distinctly different.  As with any anesthesia, there are risks, which may be increased if you already have heart disease, chronic lung conditions, or other serious medical problems.    General anesthesia puts you to sleep for your surgery.  It acts on your brain and nerves, and affects your entire body.  It may be administered by an injection, or through inhaling medication.  After you are asleep, a breathing tube may be  placed in your windpipe to help you breathe during surgery.  General anesthesia may be more appropriate for longer or more involved surgery, especially if the position you will be in surgery is uncomfortable.  With general anesthesia you may experience a sore throat and hoarse voice for a few days, headache, nausea/vomiting, drowsiness, or blood pressure and breathing problems.    Regional anesthesia involves blocking the nerves to a specific area of the body with local  anesthesia medication ("numbing medicine").  It is usually given in conjunction with varying degrees of twilight sedation.  When at all possible, the recommended type of anesthesia for both total knee replacement (TKA) and total hip replacement (THA) is a regional anesthesia technique.  This can be achieved utilizing a spinal block technique, or third placement of an epidural catheter.  In addition, your anesthesiologist may recommend that you have a peripheral nerve block placed to help with pain after the procedure.    -Spinal block-In a spinal block, local anesthetic (i.e. numbing medicine) is injected into the fluid that baths the spinal cord in the lower part of your back.  This produces a rapid numbing effect that wears off in a few hours.  After meeting the anesthesiologist and discussing her medical conditions and history, the anesthesiologist may decide to place a long-acting pain medicine as well.  There are some medical conditions and home medications that may preclude a spinal block.    -Epidural block-An epidural block uses a catheter inserted into your lower back to deliver numbing medicine.  The epidural block in the spinal block are administered in a very similar location and technique; however, the epidural catheter is placed in a slightly different area around the spine as compared to a spinal block.    -Peripheral nerve block (PNB)-For TKA, the anesthesiologist may discuss performing a PNB.  This technique places local anesthetic  directly around the major nerves in your thigh, and one or multiple locations.  These blocks numb only the leg that is injected, and do not affect the other leg.  PNB's are used in addition to another anesthesia technique, and are for postoperative pain relief only.    The advantages of regional anesthesia for joint replacement surgery are considered to be significant.  There is a significant reduction in blood loss and blood transfusions, less nausea/vomiting, less drowsiness, improved pain control after surgery, better diabetes control, and less association with infection.  Additionally, there also are reduced risks (as compared to general anesthesia techniques) with serious medical complications such as heart attack, stroke, pneumonia, respiratory depression, or development of a blood clot in your legs that can go to your lungs.  Like with any technique, there are risks.  With regional anesthesia, there is a low incidence of headache and nerve damage.  Most commonly though, if the regional technique proves challenging, it is a difficulty in getting the numbing medicine in close proximity to the nerves perform the nerve block.  Extremely rarely (1 in 200,000), there can be a collection of blood from around the nerves.  Some patients may experience difficulty voiding (passing urine) for a period of time after a regional technique.

## 2022-10-05 ENCOUNTER — Inpatient Hospital Stay: Payer: Medicaid (Managed Care) | Attending: Colon & Rectal Surgery

## 2022-10-05 MED ORDER — ACETAMINOPHEN 10 MG/ML IV SOLN
10 MG/ML | Freq: Once | INTRAVENOUS | Status: DC
Start: 2022-10-05 — End: 2022-10-05

## 2022-10-05 MED ORDER — BUPIVACAINE-EPINEPHRINE (PF) 0.5% -1:200000 IJ SOLN
INTRAMUSCULAR | Status: AC
Start: 2022-10-05 — End: ?

## 2022-10-05 MED ORDER — FENTANYL 0.05 MG/ML SOLN (MIXTURES ONLY)
Status: DC | PRN
Start: 2022-10-05 — End: 2022-10-05
  Administered 2022-10-05 (×2): 50 via INTRAVENOUS

## 2022-10-05 MED ORDER — LACTATED RINGERS IV SOLN
INTRAVENOUS | Status: DC | PRN
Start: 2022-10-05 — End: 2022-10-05
  Administered 2022-10-05 (×2): via INTRAVENOUS

## 2022-10-05 MED ORDER — NORMAL SALINE FLUSH 0.9 % IV SOLN
0.9 % | Freq: Two times a day (BID) | INTRAVENOUS | Status: DC
Start: 2022-10-05 — End: 2022-10-05

## 2022-10-05 MED ORDER — NORMAL SALINE FLUSH 0.9 % IV SOLN
0.9 % | INTRAVENOUS | Status: DC | PRN
Start: 2022-10-05 — End: 2022-10-05

## 2022-10-05 MED ORDER — LIDOCAINE HCL (PF) 1 % IJ SOLN
1 % | Freq: Once | INTRAMUSCULAR | Status: AC
Start: 2022-10-05 — End: 2022-10-05
  Administered 2022-10-05: 15:00:00 1 mL via INTRADERMAL

## 2022-10-05 MED ORDER — LACTATED RINGERS IV SOLN
INTRAVENOUS | Status: DC
Start: 2022-10-05 — End: 2022-10-05
  Administered 2022-10-05: 15:00:00 via INTRAVENOUS

## 2022-10-05 MED ORDER — MIDAZOLAM HCL 2 MG/2ML IJ SOLN
2 MG/ML | INTRAMUSCULAR | Status: AC
Start: 2022-10-05 — End: ?

## 2022-10-05 MED ORDER — OXYCODONE HCL 5 MG PO TABS
5 MG | ORAL_TABLET | Freq: Four times a day (QID) | ORAL | 0 refills | Status: AC | PRN
Start: 2022-10-05 — End: 2022-10-13

## 2022-10-05 MED ORDER — ACETAMINOPHEN 10 MG/ML IV SOLN
10 MG/ML | Freq: Once | INTRAVENOUS | Status: AC
Start: 2022-10-05 — End: 2022-10-05
  Administered 2022-10-05: 15:00:00 1000 mg via INTRAVENOUS

## 2022-10-05 MED ORDER — PROPOFOL 200 MG/20ML IV EMUL
200 MG/20ML | INTRAVENOUS | Status: DC | PRN
Start: 2022-10-05 — End: 2022-10-05
  Administered 2022-10-05 (×2): 20 via INTRAVENOUS
  Administered 2022-10-05: 16:00:00 100 via INTRAVENOUS
  Administered 2022-10-05 (×3): 20 via INTRAVENOUS

## 2022-10-05 MED ORDER — SODIUM CHLORIDE 0.9 % IV SOLN
0.9 % | INTRAVENOUS | Status: DC | PRN
Start: 2022-10-05 — End: 2022-10-05

## 2022-10-05 MED ORDER — FENTANYL CITRATE (PF) 100 MCG/2ML IJ SOLN
100 MCG/2ML | INTRAMUSCULAR | Status: AC
Start: 2022-10-05 — End: ?

## 2022-10-05 MED ORDER — BUPIVACAINE-EPINEPHRINE 0.5% -1:200000 IJ SOLN (MIXTURES ONLY)
INTRAMUSCULAR | Status: DC | PRN
Start: 2022-10-05 — End: 2022-10-05
  Administered 2022-10-05: 16:00:00 30 via SUBCUTANEOUS

## 2022-10-05 MED ORDER — MIDAZOLAM HCL 2 MG/2ML IJ SOLN
2 MG/ML | INTRAMUSCULAR | Status: DC | PRN
Start: 2022-10-05 — End: 2022-10-05
  Administered 2022-10-05: 15:00:00 2 via INTRAVENOUS

## 2022-10-05 MED ORDER — MINERAL OIL PO OIL
Freq: Three times a day (TID) | ORAL | 2 refills | Status: AC | PRN
Start: 2022-10-05 — End: 2022-12-28

## 2022-10-05 MED FILL — MIDAZOLAM HCL 2 MG/2ML IJ SOLN: 2 MG/ML | INTRAMUSCULAR | Qty: 2

## 2022-10-05 MED FILL — SENSORCAINE-MPF/EPINEPHRINE 0.5% -1:200000 IJ SOLN: INTRAMUSCULAR | Qty: 30

## 2022-10-05 MED FILL — FENTANYL CITRATE (PF) 100 MCG/2ML IJ SOLN: 100 MCG/2ML | INTRAMUSCULAR | Qty: 2

## 2022-10-05 MED FILL — LACTATED RINGERS IV SOLN: INTRAVENOUS | Qty: 1000

## 2022-10-05 MED FILL — ACETAMINOPHEN 10 MG/ML IV SOLN: 10 MG/ML | INTRAVENOUS | Qty: 100

## 2022-10-05 NOTE — Anesthesia Pre-Procedure Evaluation (Signed)
Department of Anesthesiology  Preprocedure Note       Name:  Diana Salazar   Age:  46 y.o.  DOB:  1976/04/01                                          MRN:  [id number]         Date:  10/05/2022      Surgeon: Juliann Mule):  Malon Kindle, MD    Procedure: Procedure(s):  EXAM UNDER ANESTHESIA; EXCISION & FULGURATION OF ANAL CONDYLOMA    Medications prior to admission:   Prior to Admission medications    Not on File       Current medications:    No current facility-administered medications for this encounter.       Allergies:    Allergies   Allergen Reactions   . Codeine Itching   . Ibuprofen Swelling     Leg swelling  Leg swelling     . Prednisone      Other reaction(s): other/intolerance  Other reaction(s): other/intolerance  Pt states it makes her "angry"  Pt states it makes her "angry"     . Sulfa Antibiotics    . Sulfamethoxazole-Trimethoprim Itching       Problem List:    Patient Active Problem List   Diagnosis Code   . 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 Anal fissure    . Back pain    . Condyloma    . Obesity    . Other ill-defined conditions(799.89)    . Sleep apnea     does not use cpap   . [facility]        Past Surgical History:        Procedure Laterality Date   . CESAREAN SECTION     . EYE SURGERY Bilateral     cataracts   . SALPINGECTOMY Bilateral     due to ectopic pregnancy x2       Social History:    Social History     Tobacco Use   . Smoking status: Every Day     Packs/day: 0.50     Years: 30.00     Additional pack years: 0.00     Total pack years: 15.00     Types: Cigarettes   . Smokeless tobacco: Never   Substance Use Topics   . Alcohol use: Yes     Comment: OCC                                Ready to quit: Not Answered  Counseling given: Not Answered      Vital Signs (Current):   Vitals:    09/06/22 0921 10/04/22 1408   Weight: 281 lb (127.5 kg) 280 lb (127 kg)   Height: _0  (1.626 m) _1  (1.626 m)                                               BP Readings from Last 3 Encounters:   No data found for BP       NPO Status:  BMI:   Wt Readings from Last 3 Encounters:   10/04/22 280 lb (127 kg)     Body mass index is 48.06 kg/m.    CBC: No results found for: "WBC", "RBC", "HGB", "HCT", "MCV", "RDW", "PLT"    CMP: No results found for: "NA", "K", "CL", "CO2", "BUN", "CREATININE", "GFRAA", "AGRATIO", "LABGLOM", "GLUCOSE", "GLU", "PROT", "CALCIUM", "BILITOT", "ALKPHOS", "AST", "ALT"    POC Tests: No results for input(s): "POCGLU", "POCNA", "POCK", "POCCL", "POCBUN", "POCHEMO", "POCHCT" in the last 72 hours.    Coags: No results found for: "PROTIME", "INR", "APTT"    HCG (If Applicable): No results found for: "PREGTESTUR", "PREGSERUM", "HCG", "HCGQUANT"     ABGs: No results found for: "PHART", "PO2ART", "PCO2ART", "HCO3ART", "BEART", "O2SATART"     Type & Screen (If Applicable):  No results found for: "LABABO", "LABRH"    Drug/Infectious Status (If Applicable):  No results found for: "HIV", "HEPCAB"    COVID-19 Screening (If Applicable): No results found for: "COVID19"        Anesthesia Evaluation  Patient summary reviewed and Nursing notes reviewed  Airway: Mallampati: II  TM distance: >3 FB   Neck ROM: full  Mouth opening: > = 3 FB   Dental: normal exam         Pulmonary: breath sounds clear to auscultation  (+) sleep apnea: on noncompliant,  current smoker (1/2 ppd x 30 years.)                           Cardiovascular:  Exercise tolerance: good (>4 METS),           Rhythm: regular                      Neuro/Psych:   Negative Neuro/Psych ROS              GI/Hepatic/Renal:   (+) morbid obesity          Endo/Other:                     Abdominal:             Vascular: negative vascular ROS.         Other Findings:           Anesthesia Plan      general and MAC     ASA 3       Induction: intravenous.  continuous noninvasive hemodynamic monitor  MIPS: Postoperative opioids  intended.  Anesthetic plan and risks discussed with patient.      Plan discussed with CRNA.                    Arletha Pili, MD   10/05/2022

## 2022-10-05 NOTE — Progress Notes (Signed)
Discharge caregiver and ride:    Rosetta Posner - dtr: [phone]

## 2022-10-05 NOTE — Anesthesia Post-Procedure Evaluation (Signed)
Department of Anesthesiology  Postprocedure Note    Patient: Diana Salazar  MRN: [id number]  Birthdate: Sep 19, 1976  Date of evaluation: 10/05/2022      Procedure Summary     Date: 10/05/22 Room / Location: [facility]IN 11 / [place] [facility] OR    Anesthesia Start: 1126 Anesthesia Stop: 1207    Procedure: EXAM UNDER ANESTHESIA; EXCISION & FULGURATION OF ANAL CONDYLOMA (Rect[place]) Diagnosis:       Chronic anal fissure      Condyloma acuminatum of anus      (Chronic anal fissure [K60.1])      (Condyloma acuminatum of anus [A63.0])    Surgeons: Malon Kindle, MD Responsible Provider: Noelle Penner, MD    Anesthesia Type: MAC ASA Status: 3          Anesthesia Type: MAC    Aldrete Phase I:      Aldrete Phase II: Aldrete Score: 10      Anesthesia Post Evaluation    Patient location during evaluation: PACU  Patient participation: complete - patient participated  Level of consciousness: awake  Airway patency: patent  Nausea & Vomiting: no vomiting  Complications: no  Cardiovascular status: blood pressure returned to baseline  Respiratory status: acceptable  Hydration status: euvolemic  Multimodal analgesia pain management approach  Pain management: adequate

## 2022-10-05 NOTE — Interval H&P Note (Signed)
Update History & Physical    The patient's History and Physical of October 05, 2022 was reviewed with the patient and I examined the patient. There was no change. The surgical site was confirmed by the patient and me.     Plan: The risks, benefits, expected outcome, and alternative to the recommended procedure have been discussed with the patient. Patient understands and wants to proceed with the procedure.     Electronically signed by Malon Kindle, MD on 10/05/2022 at 11:16 AM

## 2022-10-05 NOTE — Discharge Instructions (Addendum)
[facility]  Office: [phone]  Fax: [phone]     Anal and Rectal Surgery Discharge Instructions    These instructions will cover the most common concerns following surgery on the anal area (lateral internal and sphincterotomy, incision and drainage of abscess, mucosal flap repair of fistula, hemorrhoidectomy, skin tag excision, pilonidal cyst). If you have further questions or problems, please contact our office.  The office is open Monday - Friday 8:30 AM to 4:30 pm. If emergencies arise after business hours, the answering service can contact the on-call physician. The number for the office and answering service is [phone].    General Anesthesia/Sedation  Do not engage in any activity that requires physical/mental coordination, as the medications may cause drowsiness and dizziness.  Do not drive or operate heavy machinery for 24 hours.  Do not consume alcohol, tranquilizers or sleeping medications for 24 hours.  You must have someone home with you today.    Activity  You are advised to go directly home. Restrict your activities and rest for a day.  Resume light activity tomorrow unless instructed differently.  Limit activity for at least the first 3 days, avoiding prolonged standing, walking, and sitting.  Reclining is best.  The longer you spend upright, the more pressure, swelling, and discomfort you will experience. Gradually increase your activity as tolerated starting on day 4 as long as you remain comfortable.  Avoid straining or heavy lifting until you are pain free.    Fluids and Diet  Begin with a light diet (soup, toast, crackers).  Unless instructed differently, you may advance to regular food as soon as you like.  Make sure to stay well-hydrated.    Pain  Unless allergic, take over the counter pain medications as directed below.  The combination of acetaminophen and ibuprofen is as effective as narcotics, and does not cause constipation.   Ibuprofen (Motrin,  Advil) dose:  400- 600 mg 3 times a day.  Take with food or milk  Acetaminophen (Tylenol) dose:  975 mg (3 regular strength tabs) or 1000 mg (2 extra-strength tabs) every 6 hours.  A narcotic pain medication has also been prescribed by your surgeon. Take as directed for pain not relieved by over the counter medications.  Narcotic pain medications can cause constipation and nausea, and have the potential for addiction, so limit their use as much as possible.  Soak in a tub of warm water and/or use warm packs or compresses regularly.    Bowel Regimen  Take a fiber supplement every day (i.e. Citrucel, Metamucil, Benefiber) and a stool softener (i.e. Colace) to regulate the bowels after surgery, especially if narcotic pain medication is being used. Miralax, Milk of Magnesia, and Mineral oil (oral) are gentle laxatives you can use if you become constipated. It is important to continue to have normal, formed bowel movements- DO NOT try to "hold in" your bowel movements- it will make you more uncomfortable and can result in a much more painful bowel movement.    Wound Care  You may shower or bathe normally.  It is normal to have a little bleeding with bowel movements for days, and sometimes on and off for a month or so. Thick, foul-smelling drainage can be a sign of infection. Call the office if this occurs. Gauze pads or a menstrual pad can be used to protect clothing from drainage. It is fine to wash the wound in the shower. Avoid putting soap directly into the incision.  Keep wound or  incision covered with antibiotic ointment and dry gauze.    Follow-Up  Unless otherwise instructed, call the office [phone]) when you get home to make an appointment to see your surgeon approximately 4 weeks after discharge from the hospital. Also call the office for:  Fever greater than 101.5 F  Inability to urinate for more than 8 hours  Warmth, redness, or foul-smelling drainage from around the incision  Sudden loss of bright red  blood from surgical site or rectum (greater than 1/2 cup).

## 2022-10-05 NOTE — Op Note (Signed)
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  No evidence of anal canal condyloma.    Fluids: 500 mL crystalloid  Urine output: None    Indication for procedure this is a pleasant 46 year old female referred to me by Dr. Tasia Catchings due to concerns for perianal lesion.  Patient with a history of vulvar cancer 9 years ago and underwent surgery for this.  On recent examination she was found to have a concerning perianal lesion that may be condyloma.  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  Patient advised to undergo excision and fulguration in the operating room.  All risks and benefits of the procedure were explained to patient in full detail who agreed to the operation and signed consent.        Detailed Description of Procedure:   Patient identified taken to the operating room.  Space on the OR table in the supine position given MAC anesthesia and sedated.  She was then placed into the high lithotomy position using candycane stirrups.  The buttocks then taped apart to expose the perianal area.  The perianal area was then prepped and draped in the usual sterile fashion.  Surgical pause and performed to verify correct patient, correct location, and correct  procedure.  Once performed 30 mL 0.5% Marcaine with epinephrine was instilled to the perianal skin and the under sterile groove.  A digital rectal was performed.  There were no tumors or masses palpated.  A small Hill-Carpino retractor was inserted the anal canal was inspected circumferentially with above-mentioned findings.  We focused our attention at the left posterior location.  There is a small hypertrophic anal polyp present that was excised without difficulty using electrocautery.  We then turned our attention to the focal area of anal condyloma at this location.  This was easily fulgurated using electrocautery to remove all condylomatous tissue.  Bleeding points were controlled electrocautery.  Similar procedure was performed at the right posterior location for small focus of condyloma over at this location.  Again all bleeding points were controlled with electrocautery.  The area was then cleaned and dried.  There were no other abnormalities noted.  Bacitracin was applied to the perianal skin and the buttock tape was removed.  4 x 4 gauze placed over the area and secured with silk tape.  Patient then placed back onto the gurney in the supine position woken up and taken recovery area in stable condition.  All sharps were accounted for and disposed of and all counts were correct  x2.    Electronically signed by Malon Kindle, MD on 10/05/2022 at 12:02 PM

## 2022-12-24 NOTE — Anesthesia Pre-Procedure Evaluation (Incomplete)
Department of Anesthesiology  Preprocedure Note       Name:  Diana Salazar   Age:  46 y.o.  DOB:  [birth year]                                          MRN:  [id number]         Date:  12/28/2022      Surgeon: Moishe Spice):  Salvatore Marvel, MD    Procedure: Procedure(s):  TOTAL VAGINAL HYSTERECTOMY   DOS: 01/10/2023  BMI: 47.0        PCP: Raynald Kemp MD  Not been seen yet        Medications prior to admission:   Prior to Admission medications    Medication Sig Start Date End Date Taking? Authorizing Provider   diphenhydrAMINE (SOMINEX) 25 MG tablet Take 2 tablets by mouth nightly as needed for Sleep   Yes [provider]       Current medications:    Current Outpatient Medications   Medication Sig Dispense Refill   . diphenhydrAMINE (SOMINEX) 25 MG tablet Take 2 tablets by mouth nightly as needed for Sleep       No current facility-administered medications for this encounter.       Allergies:    Allergies   Allergen Reactions   . Codeine Itching   . Ibuprofen Swelling     Leg swelling  Leg swelling     . Prednisone      Other reaction(s): other/intolerance  Other reaction(s): other/intolerance  Pt states it makes her "angry"  Pt states it makes her "angry"     . Sulfa Antibiotics    . Sulfamethoxazole-Trimethoprim Itching       Problem List:    Patient Active Problem List   Diagnosis Code   . 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 Anal fissure    . Back pain    . Condyloma    . COVID 05/2021    MODERATE FLU SYMPTOMS   . Depression    . Endometriosis    . Obesity    . Sleep apnea     does not use cpap   . Uterine fibroid    . Vulvar cancer [facility]        Past Surgical History:        Procedure Laterality Date   . CESAREAN SECTION     . CONDYLOMA EXCISION N/A 10/05/2022    EXAM UNDER ANESTHESIA; EXCISION & FULGURATION OF ANAL CONDYLOMA performed by Jeral Pinch, MD at [facility] MAIN OR   . EYE SURGERY Bilateral     cataracts   . OTHER SURGICAL HISTORY  03/13/2013    right  radical hemivulvectomy,right inguinal node dissection   . SALPINGECTOMY Bilateral     due to ectopic pregnancy x2   . SALPINGO-OOPHORECTOMY  2012    LAPAROSCOPIC       Social History:    Social History     Tobacco Use   . Smoking status: Every Day     Current packs/day: 0.50     Average packs/day: 0.5 packs/day for 30.0 years (15.0 ttl pk-yrs)     Types: Cigarettes   . Smokeless tobacco: Never   Substance Use Topics   . Alcohol use: Yes  Comment: 2-3 DRINKS/YEAR                                Ready to quit: Not Answered  Counseling given: Not Answered      Vital Signs (Current):   Vitals:    12/28/22 1128   Weight: 124.3 kg (274 lb)   Height: 1.626 m (5\' 4" )                                              BP Readings from Last 3 Encounters:   10/05/22 95/60       NPO Status:                                                                                 BMI:   Wt Readings from Last 3 Encounters:   12/28/22 124.3 kg (274 lb)   10/05/22 129.7 kg (286 lb)     Body mass index is 47.03 kg/m.       LABS TO BE DONE AT [facility] AND WILL BE ON THE PAPER CHART      CBC: CMP:     Type & Screen : 12/28/2022      COVID-19 Screening: + COVID 2022 - moderate fever, flu like - has resolved      EKG 12/28/2022      EKG 07/19/2021:  BORDERLINE ECG -     Impression  SR-Sinus rhythm-normal P axis, V-rate 50-99    Impression  ET-Abnormal R-wave progression, early transition-QRS area>0 in V2    Impression  REPB-Borderline repolarization abnormality-ST dep & abnormal T    Impression  -sinus-            ECHO 10/05/2022:  Narrative    CONCLUSIONS   * Left ventricular chamber size and wall thickness is normal.   * Left ventricular systolic function is normal with an ejection fraction of   61 % by Simpson's biplane.   * Left ventricular segmental wall motion is normal.   * Left ventricular diastolic function: normal.   * No hemodynamically significant valvular disease.   * There is trace tricuspid valve regurgitation.   * No  pulmonary hypertension, estimated pulmonary arterial systolic pressure is 25 mmHg.     Comparison   * No prior study is available for comparison.       MYO Resistance 07/20/2021:  Narrative  CONCLUSIONS   * ECG response is negative.   * Transient ischemic dilatation absent.   * Stress SPECT tomographic images reveal homogeneous tracer uptake   throughout the myocardium. Rest images show no significant change.   * Post stress left ventricular ejection fraction is normal, 70 %.   * Post stress images segmental wall thickening and motion are normal.   * Myocardial perfusion imaging is normal.   * This is a low risk scan.           Anesthesia Evaluation  Patient summary reviewed and Nursing notes reviewed  Airway: Mallampati: II  TM  distance: >3 FB   Neck ROM: full  Mouth opening: > = 3 FB   Dental: normal exam         Pulmonary:normal exam  breath sounds clear to auscultation  (+) sleep apnea ( Does not have a CPAP currently): on noncompliant,  current smoker ( smokes 1/2 PPD x 30 years - ETOH rarely)                           Cardiovascular:Negative CV ROS  Exercise tolerance: good (>4 METS),           Rhythm: regular  Rate: normal                    Neuro/Psych:   (+) depression/anxiety             GI/Hepatic/Renal: Neg GI/Hepatic/Renal ROS  (+) morbid obesity          Endo/Other:    (+) malignancy/cancer ( Vulvar carcinoma - had excision ).                 Abdominal:   (+) obese,           Vascular: negative vascular ROS.         Other Findings:           Anesthesia Plan      Anesthesia consult note done  Blair Promise, [place]   12/28/2022

## 2022-12-28 ENCOUNTER — Inpatient Hospital Stay: Admit: 2022-12-28 | Payer: MEDICAID

## 2022-12-28 DIAGNOSIS — Z01810 Encounter for preprocedural cardiovascular examination: Secondary | ICD-10-CM

## 2022-12-28 LAB — EKG 12-LEAD
Atrial Rate: 71 {beats}/min
Calculated P Axis: 36 degrees
Calculated R Axis: 47 degrees
Calculated T Axis: 39 degrees
DIAGNOSIS, 93000: NORMAL
P-R Interval: 146 ms
Q-T Interval: 434 ms
QRS Duration: 82 ms
QTC Calculation (Bezet): 471 ms
Ventricular Rate: 71 {beats}/min

## 2022-12-28 LAB — ANTIBODY SCREEN: Antibody Screen: NEGATIVE

## 2022-12-28 LAB — ABO/RH: ABO/Rh: O POS

## 2022-12-28 NOTE — Other (Signed)
PREOPERATIVE INSTRUCTIONS  Please Read Carefully    _0  Your procedure is scheduled on:01/10/2023    _1  The day before your surgery, call the surgeon's office to check on the surgery time and the time you should arrive.     _2  On the day of your surgery, arrive at the time given to you by your surgeon and check in at the first floor registration desk.    _3  DO NOT eat anything after midnight before your surgery. This includes gum, mints or hard candy.  May have Water, black coffee (NO CREAM) or regular Gatorade (not sugar free) up to 3 hours prior to surgery, but no more than 6 ounces per hour (approx.  cup).    _4  You may brush your teeth the morning of surgery, however DO NOT swallow any water.    _5  No smoking after midnight.  Smoking should be reduced a few days prior to surgery.    _6  If you are having an outpatient procedure, you must have a responsible adult bring you to the hospital.  They must remain in the surgical waiting area for the duration of your procedure and provide you with transportation home.  You may not drive yourself home.  We also recommend that you arrange for a responsible person to stay with you for 24 hours following your procedure.  Failure to comply with these instructions may result in cancellation of the procedure.    _7  A parent or legal guardian must accompany minors to the hospital.  LEGAL GUARDIANS MUST bring custody papers with them the day of surgery.    _8  If the lab gives you a blue blood ID band, do not throw it away.  Bring it with you on the day of surgery.      _9  Please return to preop surgical testing no more than 14 days before surgery date to have your type and screen done prior to surgery.    _10  If you have been diagnosed with Sleep Apnea and are using a CPAP, BiPAP, and/or Bi-Flex machine, please bring it with you the day of surgery.    _11  Endoscopy patients, please follow the instructions provided by the doctors office.    _12  If crutches are  ordered, you must get them and be instructed on proper use before the day of surgery.  Please bring them with you the day of surgery.      PREPARING THE SKIN BEFORE SURGERY Can Reduce the Risk of Infection  _13  You have been given a Chlorhexidine skin preparation packet with instructions to bathe the night before surgery and the morning of surgery prior to arrival.    _14  If allergic to Chlorhexidine, use Dial (antibacterial) soap to bathe your skin the night before surgery and the morning of surgery.    _15  Do not shave your face, underarms, legs or any part of your body at least 48 hours before surgery.  Any clipping needed for surgery will be done at the hospital.      [place]  _16  Wear casual, loose fitting comfortable clothes the day of surgery.    _17  Remove ALL jewelry, including body piercings.  Leave these and all valuables at home.  Leave suitcases and/or overnight bag at home or in the car for a family member to bring when you get a room.    _18  DO NOT wear any makeup, nail polish, deodorant, body lotion, aftershave or contact lenses the day of surgery.  Please bring your glasses and glasses case with you the day of surgery.    _0  Bring any additional paperwork, such as doctors orders, consent form, POA (power of attorney), and/or advanced directives with you the day of surgery.    _1  Please notify your surgeon of any changes in your condition such as fever, sore throat or rash as soon as possible before your surgery date.  After office hours, call your surgeon's answering service.    _2 Bring picture identification and insurance cards with you the day of surgery.      MEDICATIONS:  _3  Stop taking aspirin and aspirin products/NSAIDs (non-steroidal anti-inflammatory drugs such as Motrin, Aleve, Advil, ibuprofen and BC Powder), vitamins and herbal pills 2 weeks before surgery OR as instructed by your doctor.  However, if you take a daily aspirin, please contact your primary care  provider (PCP), for instructions prior to surgery.    _4  Contact your doctor regarding any blood thinner medications you take (such as Coumadin, Plavix, etc.) for instructions about what to do prior to surgery.    _5 If you have diabetes, hold oral diabetic medications the night before surgery and the morning of surgery.  Hold insulin the morning of surgery unless told otherwise by your doctor.    _6  If you have asthma or use inhalers please bring them the day of surgery.    _7 Take the following medications (oral medications with a sip of water)  the day of surgery:   NONE    If needed:        Prior to Visit Medications    Medication Sig Taking? Authorizing Provider   diphenhydrAMINE (SOMINEX) 25 MG tablet Take 2 tablets by mouth nightly as needed for Sleep Yes [provider]        *Visitor Policy-Surgical/Procedural Areas: Two people may escort the patient to the department's waiting room and may stay there during the procedure.  One visitor must be 47 years of age or older and the patient must arrive with their post procedure transportation home before procedure start time. The post-procedure transporting party must remain within the surgical waiting room for the duration of the procedure. Failure may result in the cancellation of the procedure*  Surgical Admitting Unit (SAU)/Phase II/Post Anesthesia Care Unit (PACU): Patients are allowed one visitor at a time. Visitors must be 59 or older. Note: If the patient's time in PACU is expected to be less than one hour, visitation may occur in Phase II Recovery or the patient's room. Visitation is generally limited to 5 - 10 minutes. Pediatric patients are allowed two visitors to accommodate both parents if the patient is hemodynamically stable.      We want you to have a positive experience at [facility].  If any of these these instructions are not met, it is possible that your surgery will be canceled.  If you have any questions  regarding your surgery, please call [facility] at [phone] or your surgeon's office for further assistance.           [facility]    [facility] Anesthesia     There are many ways to perform anesthesia for surgery.  The 2 techniques are generally classified as General Anesthesia and Regional Anesthesia.  While both techniques are very safe, they are distinctly different.  As with any anesthesia, there are risks, which may be increased if you already have heart disease, chronic lung conditions, or other serious medical problems.    General anesthesia puts  you to sleep for your surgery.  It acts on your brain and nerves, and affects your entire body.  It may be administered by an injection, or through inhaling medication.  After you are asleep, a breathing tube may be placed in your windpipe to help you breathe during surgery.  General anesthesia may be more appropriate for longer or more involved surgery, especially if the position you will be in surgery is uncomfortable.  With general anesthesia you may experience a sore throat and hoarse voice for a few days, headache, nausea/vomiting, drowsiness, or blood pressure and breathing problems.    Regional anesthesia involves blocking the nerves to a specific area of the body with local anesthesia medication ("numbing medicine").  It is usually given in conjunction with varying degrees of twilight sedation.  When at all possible, the recommended type of anesthesia for both total knee replacement (TKA) and total hip replacement (THA) is a regional anesthesia technique.  This can be achieved utilizing a spinal block technique, or third placement of an epidural catheter.  In addition, your anesthesiologist may recommend that you have a peripheral nerve block placed to help with pain after the procedure.    -Spinal block-In a spinal block, local anesthetic (i.e. numbing medicine) is injected into the fluid that baths the spinal cord in the lower part of your back.  This  produces a rapid numbing effect that wears off in a few hours.  After meeting the anesthesiologist and discussing her medical conditions and history, the anesthesiologist may decide to place a long-acting pain medicine as well.  There are some medical conditions and home medications that may preclude a spinal block.    -Epidural block-An epidural block uses a catheter inserted into your lower back to deliver numbing medicine.  The epidural block in the spinal block are administered in a very similar location and technique; however, the epidural catheter is placed in a slightly different area around the spine as compared to a spinal block.    -Peripheral nerve block (PNB)-For TKA, the anesthesiologist may discuss performing a PNB.  This technique places local anesthetic directly around the major nerves in your thigh, and one or multiple locations.  These blocks numb only the leg that is injected, and do not affect the other leg.  PNB's are used in addition to another anesthesia technique, and are for postoperative pain relief only.    The advantages of regional anesthesia for joint replacement surgery are considered to be significant.  There is a significant reduction in blood loss and blood transfusions, less nausea/vomiting, less drowsiness, improved pain control after surgery, better diabetes control, and less association with infection.  Additionally, there also are reduced risks (as compared to general anesthesia techniques) with serious medical complications such as heart attack, stroke, pneumonia, respiratory depression, or development of a blood clot in your legs that can go to your lungs.  Like with any technique, there are risks.  With regional anesthesia, there is a low incidence of headache and nerve damage.  Most commonly though, if the regional technique proves challenging, it is a difficulty in getting the numbing medicine in close proximity to the nerves perform the nerve block.  Extremely rarely (1  in 200,000), there can be a collection of blood from around the nerves.  Some patients may experience difficulty voiding (passing urine) for a period of time after a regional technique.

## 2022-12-29 NOTE — Other (Signed)
Left message with Selinda Eon for [facility] sterilization form. No labs done yet at [place]

## 2023-01-05 NOTE — Other (Signed)
Called pt regarding completing her pre op blood work, she states she is going to go to [facility] to have her CBC done.

## 2023-01-10 ENCOUNTER — Inpatient Hospital Stay: Payer: Medicaid (Managed Care) | Attending: Obstetrics & Gynecology

## 2023-01-10 LAB — HEMOGLOBIN AND HEMATOCRIT
Hematocrit: 39.4 % (ref 35.0–47.0)
Hemoglobin: 12.7 gm/dl (ref 11.0–16.0)

## 2023-01-10 MED ORDER — MIDAZOLAM HCL 2 MG/2ML IJ SOLN
2 MG/ML | INTRAMUSCULAR | Status: DC | PRN
Start: 2023-01-10 — End: 2023-01-10
  Administered 2023-01-10: 15:00:00 2 via INTRAVENOUS

## 2023-01-10 MED ORDER — KETOROLAC TROMETHAMINE 30 MG/ML IJ SOLN
30 MG/ML | Freq: Four times a day (QID) | INTRAMUSCULAR | Status: DC | PRN
Start: 2023-01-10 — End: 2023-01-11
  Administered 2023-01-10 – 2023-01-11 (×3): 30 mg via INTRAVENOUS

## 2023-01-10 MED ORDER — FENTANYL CITRATE (PF) 100 MCG/2ML IJ SOLN
100 MCG/2ML | INTRAMUSCULAR | Status: DC | PRN
Start: 2023-01-10 — End: 2023-01-10
  Administered 2023-01-10 (×2): 50 via INTRAVENOUS

## 2023-01-10 MED ORDER — HYDRALAZINE HCL 20 MG/ML IJ SOLN
20 MG/ML | INTRAMUSCULAR | Status: DC | PRN
Start: 2023-01-10 — End: 2023-01-10

## 2023-01-10 MED ORDER — MEPERIDINE HCL 25 MG/ML IJ SOLN
25 MG/ML | Freq: Once | INTRAMUSCULAR | Status: DC
Start: 2023-01-10 — End: 2023-01-10

## 2023-01-10 MED ORDER — ONDANSETRON HCL 4 MG/2ML IJ SOLN
4 MG/2ML | INTRAMUSCULAR | Status: DC | PRN
Start: 2023-01-10 — End: 2023-01-10
  Administered 2023-01-10: 15:00:00 4 via INTRAVENOUS

## 2023-01-10 MED ORDER — SODIUM CHLORIDE 0.9 % IV SOLN
0.9 % | INTRAVENOUS | Status: DC | PRN
Start: 2023-01-10 — End: 2023-01-10

## 2023-01-10 MED ORDER — SUGAMMADEX SODIUM 200 MG/2ML IV SOLN
200 MG/2ML | INTRAVENOUS | Status: AC
Start: 2023-01-10 — End: ?

## 2023-01-10 MED ORDER — LIDOCAINE HCL (PF) 2 % IJ SOLN
2 % | INTRAMUSCULAR | Status: DC | PRN
Start: 2023-01-10 — End: 2023-01-10
  Administered 2023-01-10: 15:00:00 100 via INTRAVENOUS

## 2023-01-10 MED ORDER — MIDAZOLAM HCL 2 MG/2ML IJ SOLN
2 MG/ML | INTRAMUSCULAR | Status: AC
Start: 2023-01-10 — End: ?

## 2023-01-10 MED ORDER — DIPHENHYDRAMINE HCL 50 MG/ML IJ SOLN
50 MG/ML | Freq: Once | INTRAMUSCULAR | Status: AC
Start: 2023-01-10 — End: 2023-01-10
  Administered 2023-01-10: 18:00:00 25 mg via INTRAVENOUS

## 2023-01-10 MED ORDER — OXYCODONE HCL 5 MG PO TABS
5 MG | Freq: Once | ORAL | Status: DC | PRN
Start: 2023-01-10 — End: 2023-01-10

## 2023-01-10 MED ORDER — HYDROMORPHONE HCL 1 MG/ML IJ SOLN
1 MG/ML | INTRAMUSCULAR | Status: AC | PRN
Start: 2023-01-10 — End: 2023-01-10
  Administered 2023-01-10 (×4): 0.5 mg via INTRAVENOUS

## 2023-01-10 MED ORDER — SUCCINYLCHOLINE CHLORIDE 20 MG/ML IJ SOLN
20 MG/ML | INTRAMUSCULAR | Status: DC | PRN
Start: 2023-01-10 — End: 2023-01-10
  Administered 2023-01-10: 15:00:00 160 via INTRAVENOUS

## 2023-01-10 MED ORDER — NORMAL SALINE FLUSH 0.9 % IV SOLN
0.9 % | Freq: Two times a day (BID) | INTRAVENOUS | Status: DC
Start: 2023-01-10 — End: 2023-01-11
  Administered 2023-01-11: 01:00:00 10 mL via INTRAVENOUS

## 2023-01-10 MED ORDER — LACTATED RINGERS IV SOLN
INTRAVENOUS | Status: DC
Start: 2023-01-10 — End: 2023-01-10
  Administered 2023-01-10 (×3): via INTRAVENOUS

## 2023-01-10 MED ORDER — LIDOCAINE HCL (PF) 1 % IJ SOLN
1 % | Freq: Once | INTRAMUSCULAR | Status: AC | PRN
Start: 2023-01-10 — End: 2023-01-10
  Administered 2023-01-10: 13:00:00 1 mL via INTRADERMAL

## 2023-01-10 MED ORDER — DROPERIDOL 2.5 MG/ML IJ SOLN
2.5 MG/ML | Freq: Once | INTRAMUSCULAR | Status: DC | PRN
Start: 2023-01-10 — End: 2023-01-10

## 2023-01-10 MED ORDER — HYDROMORPHONE HCL 1 MG/ML IJ SOLN
1 MG/ML | INTRAMUSCULAR | Status: DC | PRN
Start: 2023-01-10 — End: 2023-01-11
  Administered 2023-01-10 – 2023-01-11 (×4): 0.5 mg via INTRAVENOUS

## 2023-01-10 MED ORDER — LACTATED RINGERS IV SOLN
INTRAVENOUS | Status: DC
Start: 2023-01-10 — End: 2023-01-11
  Administered 2023-01-10 – 2023-01-11 (×3): via INTRAVENOUS

## 2023-01-10 MED ORDER — OXYCODONE HCL 5 MG PO TABS
5 MG | ORAL | Status: DC | PRN
Start: 2023-01-10 — End: 2023-01-11
  Administered 2023-01-10 – 2023-01-11 (×5): 10 mg via ORAL

## 2023-01-10 MED ORDER — OXYCODONE HCL 5 MG PO TABS
5 MG | ORAL | Status: DC | PRN
Start: 2023-01-10 — End: 2023-01-11

## 2023-01-10 MED ORDER — ACETAMINOPHEN 10 MG/ML IV SOLN
10 MG/ML | INTRAVENOUS | Status: DC | PRN
Start: 2023-01-10 — End: 2023-01-10
  Administered 2023-01-10: 15:00:00 1000 via INTRAVENOUS

## 2023-01-10 MED ORDER — NORMAL SALINE FLUSH 0.9 % IV SOLN
0.9 % | INTRAVENOUS | Status: DC | PRN
Start: 2023-01-10 — End: 2023-01-10

## 2023-01-10 MED ORDER — FENTANYL CITRATE (PF) 100 MCG/2ML IJ SOLN
100 MCG/2ML | INTRAMUSCULAR | Status: AC | PRN
Start: 2023-01-10 — End: 2023-01-10
  Administered 2023-01-10 (×8): 25 ug via INTRAVENOUS

## 2023-01-10 MED ORDER — NORMAL SALINE FLUSH 0.9 % IV SOLN
0.9 % | Freq: Two times a day (BID) | INTRAVENOUS | Status: DC
Start: 2023-01-10 — End: 2023-01-10

## 2023-01-10 MED ORDER — SODIUM CHLORIDE (PF) 0.9 % IJ SOLN
0.9 % | Freq: Two times a day (BID) | INTRAMUSCULAR | Status: DC
Start: 2023-01-10 — End: 2023-01-11
  Administered 2023-01-10: 19:00:00 20 mg via INTRAVENOUS

## 2023-01-10 MED ORDER — ACETAMINOPHEN 500 MG PO TABS
500 MG | Freq: Three times a day (TID) | ORAL | Status: DC
Start: 2023-01-10 — End: 2023-01-11
  Administered 2023-01-11 (×2): 1000 mg via ORAL

## 2023-01-10 MED ORDER — FAMOTIDINE 20 MG PO TABS
20 MG | Freq: Two times a day (BID) | ORAL | Status: DC
Start: 2023-01-10 — End: 2023-01-11
  Administered 2023-01-11 (×2): 20 mg via ORAL

## 2023-01-10 MED ORDER — DOCUSATE SODIUM 100 MG PO CAPS
100 MG | Freq: Two times a day (BID) | ORAL | Status: DC
Start: 2023-01-10 — End: 2023-01-11
  Administered 2023-01-10 – 2023-01-11 (×3): 100 mg via ORAL

## 2023-01-10 MED ORDER — NAPROXEN 250 MG PO TABS
250 MG | Freq: Three times a day (TID) | ORAL | Status: DC | PRN
Start: 2023-01-10 — End: 2023-01-11

## 2023-01-10 MED ORDER — HYDROMORPHONE HCL 1 MG/ML IJ SOLN
1 MG/ML | INTRAMUSCULAR | Status: DC | PRN
Start: 2023-01-10 — End: 2023-01-10
  Administered 2023-01-10 (×2): .5 via INTRAVENOUS

## 2023-01-10 MED ORDER — NORMAL SALINE FLUSH 0.9 % IV SOLN
0.9 % | INTRAVENOUS | Status: DC | PRN
Start: 2023-01-10 — End: 2023-01-11

## 2023-01-10 MED ORDER — ONDANSETRON HCL 4 MG/2ML IJ SOLN
4 MG/2ML | Freq: Four times a day (QID) | INTRAMUSCULAR | Status: DC | PRN
Start: 2023-01-10 — End: 2023-01-11

## 2023-01-10 MED ORDER — PROPOFOL 200 MG/20ML IV EMUL
200 MG/20ML | INTRAVENOUS | Status: DC | PRN
Start: 2023-01-10 — End: 2023-01-10
  Administered 2023-01-10: 15:00:00 100 via INTRAVENOUS
  Administered 2023-01-10: 15:00:00 200 via INTRAVENOUS
  Administered 2023-01-10: 15:00:00 100 via INTRAVENOUS

## 2023-01-10 MED ORDER — ROCURONIUM BROMIDE 50 MG/5ML IV SOLN
50 MG/5ML | INTRAVENOUS | Status: DC | PRN
Start: 2023-01-10 — End: 2023-01-10
  Administered 2023-01-10: 16:00:00 10 via INTRAVENOUS
  Administered 2023-01-10: 15:00:00 20 via INTRAVENOUS
  Administered 2023-01-10: 16:00:00 5 via INTRAVENOUS

## 2023-01-10 MED ORDER — SODIUM CHLORIDE 0.9 % IV SOLN
0.9 % | INTRAVENOUS | Status: DC | PRN
Start: 2023-01-10 — End: 2023-01-11

## 2023-01-10 MED ORDER — MELATONIN 3 MG PO TABS
3 MG | Freq: Every evening | ORAL | Status: DC | PRN
Start: 2023-01-10 — End: 2023-01-11

## 2023-01-10 MED ORDER — LACTATED RINGERS IV SOLN
INTRAVENOUS | Status: DC
Start: 2023-01-10 — End: 2023-01-10

## 2023-01-10 MED ORDER — SODIUM CHLORIDE 0.9 % IV SOLN (MINI-BAG)
0.9 % | INTRAVENOUS | Status: DC | PRN
Start: 2023-01-10 — End: 2023-01-10
  Administered 2023-01-10: 16:00:00 1000 via INTRAVENOUS

## 2023-01-10 MED ORDER — ONDANSETRON 4 MG PO TBDP
4 MG | Freq: Three times a day (TID) | ORAL | Status: DC | PRN
Start: 2023-01-10 — End: 2023-01-11

## 2023-01-10 MED ORDER — NICOTINE 14 MG/24HR TD PT24
14 MG/24HR | Freq: Every day | TRANSDERMAL | Status: DC
Start: 2023-01-10 — End: 2023-01-11
  Administered 2023-01-10 – 2023-01-11 (×2): 1 via TRANSDERMAL

## 2023-01-10 MED ORDER — ONDANSETRON HCL 4 MG/2ML IJ SOLN
4 MG/2ML | Freq: Once | INTRAMUSCULAR | Status: DC | PRN
Start: 2023-01-10 — End: 2023-01-10

## 2023-01-10 MED ORDER — DIPHENHYDRAMINE HCL 25 MG PO CAPS
25 MG | Freq: Four times a day (QID) | ORAL | Status: DC | PRN
Start: 2023-01-10 — End: 2023-01-11
  Administered 2023-01-11 (×2): 25 mg via ORAL

## 2023-01-10 MED ORDER — SUGAMMADEX SODIUM 200 MG/2ML IV SOLN
200 MG/2ML | INTRAVENOUS | Status: DC | PRN
Start: 2023-01-10 — End: 2023-01-10
  Administered 2023-01-10 (×2): 200 via INTRAVENOUS

## 2023-01-10 MED ORDER — HYDROMORPHONE HCL 1 MG/ML IJ SOLN
1 MG/ML | INTRAMUSCULAR | Status: DC | PRN
Start: 2023-01-10 — End: 2023-01-11
  Administered 2023-01-11: 13:00:00 0.25 mg via INTRAVENOUS

## 2023-01-10 MED ORDER — HYDROMORPHONE HCL 1 MG/ML IJ SOLN
1 MG/ML | INTRAMUSCULAR | Status: AC
Start: 2023-01-10 — End: ?

## 2023-01-10 MED ORDER — FENTANYL CITRATE (PF) 100 MCG/2ML IJ SOLN
100 MCG/2ML | INTRAMUSCULAR | Status: AC
Start: 2023-01-10 — End: ?

## 2023-01-10 MED ORDER — CEFAZOLIN 3000 MG IN NS 100 ML IVPB
Status: AC
Start: 2023-01-10 — End: 2023-01-10
  Administered 2023-01-10: 15:00:00 3000 mg via INTRAVENOUS

## 2023-01-10 MED FILL — FENTANYL CITRATE (PF) 100 MCG/2ML IJ SOLN: 100 MCG/2ML | INTRAMUSCULAR | Qty: 2

## 2023-01-10 MED FILL — OXYCODONE HCL 5 MG PO TABS: 5 MG | ORAL | Qty: 2

## 2023-01-10 MED FILL — ACETAMINOPHEN EXTRA STRENGTH 500 MG PO TABS: 500 MG | ORAL | Qty: 2

## 2023-01-10 MED FILL — LACTATED RINGERS IV SOLN: INTRAVENOUS | Qty: 1000

## 2023-01-10 MED FILL — NAPROXEN 250 MG PO TABS: 250 MG | ORAL | Qty: 1

## 2023-01-10 MED FILL — HYDROMORPHONE HCL 1 MG/ML IJ SOLN: 1 MG/ML | INTRAMUSCULAR | Qty: 1

## 2023-01-10 MED FILL — DOCUSATE SODIUM 100 MG PO CAPS: 100 MG | ORAL | Qty: 1

## 2023-01-10 MED FILL — MELATONIN 3 MG PO TABS: 3 MG | ORAL | Qty: 1

## 2023-01-10 MED FILL — CEFAZOLIN 3000 MG IN NS 100 ML IVPB: Qty: 100

## 2023-01-10 MED FILL — MIDAZOLAM HCL 2 MG/2ML IJ SOLN: 2 MG/ML | INTRAMUSCULAR | Qty: 2

## 2023-01-10 MED FILL — BRIDION 200 MG/2ML IV SOLN: 200 MG/2ML | INTRAVENOUS | Qty: 4

## 2023-01-10 MED FILL — KETOROLAC TROMETHAMINE 30 MG/ML IJ SOLN: 30 MG/ML | INTRAMUSCULAR | Qty: 1

## 2023-01-10 MED FILL — NICOTINE 14 MG/24HR TD PT24: 14 MG/24HR | TRANSDERMAL | Qty: 1

## 2023-01-10 MED FILL — DIPHENHYDRAMINE HCL 50 MG/ML IJ SOLN: 50 MG/ML | INTRAMUSCULAR | Qty: 1

## 2023-01-10 MED FILL — FAMOTIDINE (PF) 20 MG/2ML IV SOLN: 20 MG/2ML | INTRAVENOUS | Qty: 2

## 2023-01-10 NOTE — Progress Notes (Signed)
[employer] [phone]

## 2023-01-10 NOTE — Plan of Care (Signed)
Problem: Pain  Goal: Verbalizes/displays adequate comfort level or baseline comfort level  01/10/2023 2023 by Ronnette Juniper, RN  Outcome: Progressing  01/10/2023 1507 by Yolande Jolly, RN  Outcome: Progressing  Flowsheets (Taken 01/10/2023 1401)  Verbalizes/displays adequate comfort level or baseline comfort level:   Encourage patient to monitor pain and request assistance   Assess pain using appropriate pain scale     Problem: ABCDS Injury Assessment  Goal: Absence of physical injury  01/10/2023 2023 by Ronnette Juniper, RN  Outcome: Progressing  01/10/2023 1507 by Yolande Jolly, RN  Outcome: Progressing     Problem: Safety - Adult  Goal: Free from fall injury  Outcome: Progressing

## 2023-01-10 NOTE — Anesthesia Post-Procedure Evaluation (Signed)
Department of Anesthesiology  Postprocedure Note    Patient: Diana Salazar  MRN: [id number]  Birthdate: 1976-02-05  Date of evaluation: 01/10/2023    Procedure Summary     Date: 01/10/23 Room / Location: [facility] MAIN 01 / [place]    Anesthesia Start: 0937 Anesthesia Stop: 1138    Procedure: TOTAL VAGINAL HYSTERECTOMY (Vagina ) Diagnosis:       Abnormal uterine bleeding      (Abnormal uterine bleeding [N93.9])    Surgeons: Golden Circle, MD Responsible Provider: Theone Stanley, MD    Anesthesia Type: General ASA Status: 3          Anesthesia Type: General    Aldrete Phase I: Aldrete Score: 9    Aldrete Phase II:      Anesthesia Post Evaluation    Patient location during evaluation: PACU  Patient participation: complete - patient participated  Level of consciousness: awake and awake and alert  Pain score: 1  Airway patency: patent  Nausea & Vomiting: no vomiting and no nausea  Cardiovascular status: blood pressure returned to baseline and hemodynamically stable  Respiratory status: acceptable and room air  Hydration status: euvolemic  Pain management: adequate        No notable events documented.

## 2023-01-10 NOTE — Other (Signed)
01/10/23 1220   Family Communication   Contact Person Relationship to Patient Parent   Contact Person Phone Number Kennith Gain   Family/Significant Other Update Called;Updated   Delivery Origin Nurse   Message Disposition Family present - message delivered   Update Given Yes   Family Communication   Family Update Message Patient stable

## 2023-01-10 NOTE — Other (Signed)
Pre-Operative Patient Rounding and Family Updates        [x]    Hourly rounding    [x]    Offered toileting    [x]    Call bell with in reach    Update given to:       [x]   Patient                [x]   Family     Update related to:                        []  Procedure time                [] Procedure delay                               Other:

## 2023-01-10 NOTE — H&P (Signed)
See written H&P. 47 yo G8P4 with fibroid uterus, pelvic pain, and AUB. Has had an endometrial ablation. Normal endometrial biopsy. History of Cesarean section times 2. 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

## 2023-01-10 NOTE — Op Note (Unsigned)
[facility]  Operation Report  NAME:  Diana Salazar, Diana Salazar  SEX:   F  DATE: 01/10/2023  DOB: August 14, 1976  MR#    [id number]  ROOM:  [id number]  ACCT#  [id number]    cc: Curly Shores MD         PREOPERATIVE DIAGNOSES:   Abnormal uterine bleeding, pelvic pain and history of endometrial ablation and fibroid uterus.     POSTOPERATIVE DIAGNOSES:   Abnormal uterine bleeding, pelvic pain and history of endometrial ablation and fibroid uterus.     PROCEDURE:   Total vaginal hysterectomy.     SURGEON:   Curly Shores, M.D.     SURGICAL ASSISTANT:   Times 2 are present.     ESTIMATED BLOOD LOSS:   322 mL     COMPLICATIONS:   None.     SPECIMEN:   Uterus including cervix.     FINDINGS:   Enlarged fibroid uterus with significant adhesions from previous cesarean section, normal ovaries.     ANESTHESIA:   General.     DESCRIPTION OF PROCEDURE:   The patient was taken to the OR and prepped and draped in the usual fashion in the dorsal lithotomy position.  Bimanual exam revealed an enlarged fibroid uterus with an adequate pelvis, but not much descensus and not significant mobility.  A weighted vaginal speculum was then placed in the vagina and the cervix was exposed with a right-angle retractor and grasped on the anterior lip with a single-tooth tenaculum.  The cervicovaginal junction was then infiltrated with normal saline and incised using electrosurgical technique in a circumferential fashion.  The peritoneum of posterior cul-de-sac was identified, grasped with an Allis clamp and incised.  The area was widened and a longer weighted duck-billed style weighted vaginal speculum was placed into the defect after the shorter one was removed.  The uterosacral ligaments were then clamped, cut and suture ligated and kept long for future reference.  The bladder was mobilized upward more using sharp dissection.  There was noted to be a fair amount of scar tissue noted.  The Foley catheter was placed to  further identify the edges of the  bladder.  The dissection was carried along the cervix onto the uterus. Scar tissue was met at several places however, the bladder due to feeling the Foley catheter, we could tell was adequately mobilized. At this point, the cardinal ligaments were clamped, sealed and cut using vessel sealing device, as was the uterine arteries on both sides. Light to moderate bleeding was noted through portions of the case.  The areas of bleeding were identified and coagulated but due to the scar tissue more bleeding than normal was encountered.  The bladder was further mobilized and the reflection of the anterior cul-de-sac was eventually identified in the left lateral side and opened.  There was noted to be some scar tissue in the midline that was taken down allowing the Deaver retractor to be placed between the uterus and the bladder.  The dissection was then carried along both sides, both parametrial tissues, the uterine arteries were once again clamped, sealed and cut.  The dissection was carried along the patient's left side until the uteroovarian complex was identified and this was taken down by clamping and sealing, and cutting with the vessel sealing device.  The uterus was then brought out further. The cervix was tucked and using a top down approach, the utero-ovarian ligament was clamped, sealed and cut, followed by the parametrial tissue, removing the uterus.  All pedicles were examined.  There was noted to be fairly brisk bleeding noted from an arterial on the patient's right side near the uteroovarian ligament, this was grasped with a tonsil clamp and suture ligated resulting in cessation of bleeding.  There was also noted to be numerous areas around the uterosacral ligaments that were bleeding that was controlled with both suture and electrosurgical technique. Eventually, all bleeding was controlled.  At this point, the uterosacral ligaments were sutured to the posterior vagina supporting the vagina and obliterating  the cul-de-sac and then the vaginal cuff was closed in a running transverse fashion using a 2-0 Vicryl on a swedge needle. The vaginal cuff was examined and noted to be completely hemostatic.  The Foley catheter, which had been placed after entering anteriorly was noted to be draining clear urine.  It will be left in place to monitor urine output until the morning.  Due to significant blood loss, an H&H will be performed in the post-operative area.      ___________________  Golden Circle MD   Dictated HF:WYOV I. Stana Bunting, MD  SB  D: 01/10/2023 15:17:19  T: 01/10/2023 16:02:43  [id number]

## 2023-01-11 LAB — CBC
Hematocrit: 33.9 % — ABNORMAL LOW (ref 35.0–47.0)
Hemoglobin: 11 gm/dl (ref 11.0–16.0)
MCH: 29.2 pg (ref 25.4–34.6)
MCHC: 32.4 gm/dl (ref 30.0–36.0)
MCV: 89.9 fL (ref 80.0–98.0)
MPV: 9.7 fL (ref 6.0–10.0)
Platelets: 182 10*3/uL (ref 140–450)
RBC: 3.77 M/uL (ref 3.60–5.20)
RDW: 44 (ref 36.4–46.3)
WBC: 15.7 10*3/uL — ABNORMAL HIGH (ref 4.0–11.0)

## 2023-01-11 MED ORDER — OXYCODONE-ACETAMINOPHEN 5-325 MG PO TABS
5-325 MG | ORAL_TABLET | Freq: Four times a day (QID) | ORAL | 0 refills | Status: AC | PRN
Start: 2023-01-11 — End: 2023-01-14

## 2023-01-11 MED ORDER — NAPROXEN 250 MG PO TABS
250 MG | ORAL_TABLET | Freq: Three times a day (TID) | ORAL | 1 refills | Status: AC | PRN
Start: 2023-01-11 — End: ?

## 2023-01-11 MED FILL — HYDROMORPHONE HCL 1 MG/ML IJ SOLN: 1 MG/ML | INTRAMUSCULAR | Qty: 1

## 2023-01-11 MED FILL — DIPHENHYDRAMINE HCL 25 MG PO CAPS: 25 MG | ORAL | Qty: 1

## 2023-01-11 MED FILL — FAMOTIDINE 20 MG PO TABS: 20 MG | ORAL | Qty: 1

## 2023-01-11 MED FILL — SODIUM CHLORIDE FLUSH 0.9 % IV SOLN: 0.9 % | INTRAVENOUS | Qty: 10

## 2023-01-11 MED FILL — OXYCODONE HCL 5 MG PO TABS: 5 MG | ORAL | Qty: 2

## 2023-01-11 MED FILL — KETOROLAC TROMETHAMINE 30 MG/ML IJ SOLN: 30 MG/ML | INTRAMUSCULAR | Qty: 1

## 2023-01-11 MED FILL — DOCUSATE SODIUM 100 MG PO CAPS: 100 MG | ORAL | Qty: 1

## 2023-01-11 MED FILL — ACETAMINOPHEN EXTRA STRENGTH 500 MG PO TABS: 500 MG | ORAL | Qty: 2

## 2023-01-11 MED FILL — NICOTINE STEP 2 14 MG/24HR TD PT24: 14 MG/24HR | TRANSDERMAL | Qty: 1

## 2023-01-11 NOTE — Progress Notes (Signed)
Foley catheter removed per Pt request. Pt states she needs to have a BM an does not feel comfortable going with catheter in place. Pt educated on foley and that it is ok to have a BM with foley in place. Pt states foley is uncomfortable and she would prefer to have it removed. Foley removed at 0245. Pt assisted to bathroom. Pt tolerated well.

## 2023-01-11 NOTE — Progress Notes (Signed)
POD #1 s/p TVH. Doing well. VSSAF. Good urine output and able to urinate. Tolerating po well. Pain well controlled. Plan to discharge home with routine follow up and instructions.

## 2023-01-11 NOTE — Discharge Instructions (Signed)
Learning About Hysterectomy Surgery  What is a hysterectomy?     A hysterectomy is surgery to take out the uterus. The cervix is usually removed too. Sometimes the ovaries and fallopian tubes also are removed at the same time.  How is this surgery done?  There are many ways to do the surgery. The type you have may depend on your medical condition and the size and position of your uterus. It also depends on your overall health. Talk with your doctor about which type is right for you.  Abdominal surgery  This is done through a cut that the doctor makes in the lower belly. The cut is called an incision. The doctor takes out the uterus through this cut in the belly.  Vaginal surgery  This is done through the vagina. The doctor makes a small cut in the vagina instead of the belly. The uterus is removed through this cut in the vagina.  Laparoscopic surgery  The doctor puts a lighted tube (laparoscope) through small cuts in the belly. The doctor can see your organs with the scope. The doctor can insert surgical tools to cut the tissue that holds your uterus in place. Then the uterus is removed. It may be removed through small cuts in the belly. (This is called laparoscopic abdominal hysterectomy.) Or it may be removed through the vagina. (This is called laparoscopically assisted vaginal hysterectomy.)  What can you expect after your surgery?  You might go home the day of your hysterectomy or stay in the hospital for several days. Recovery can take 4 to 6 weeks. It depends on which type of surgery you have and your overall health. You won't be able to do any heavy lifting. And you will have to take it easy for a few weeks. It's common to feel more tired than usual.  After surgery, you will no longer have periods. You won't be able to get pregnant. If there's a chance that you will want to get pregnant in the future, talk to your doctor about other treatment options.  Most people can have sex without problems after they  recover from surgery. But if you have your ovaries removed, you may have vaginal dryness after the surgery. It can make sex less comfortable. A vaginal lubricant, such as Astroglide or K-Y Jelly, can help.  You may need to take hormones after your surgery if your ovaries are removed and you haven't gone through menopause. Taking out the ovaries before menopause causes a sudden drop in the hormone estrogen. Talk with your doctor about the pros and cons of taking hormones if you have your ovaries removed.  Follow-up care is a key part of your treatment and safety. Be sure to make and go to all appointments, and call your doctor if you are having problems. It is also a good idea to know your test results and keep a list of the medicines you take.  Where can you learn more?  Go to https://www.bennett.info/ and enter H610 to learn more about "Learning About Hysterectomy Surgery."  Current as of: April 14, 2022               Content Version: 13.9   2006-2023 [employer].   Care instructions adapted under license by [facility]. If you have questions about a medical condition or this instruction, always ask your healthcare professional. [employer] any warranty or liability for your use of this information.

## 2023-01-26 ENCOUNTER — Emergency Department: Admit: 2023-01-26 | Payer: MEDICAID

## 2023-01-26 ENCOUNTER — Inpatient Hospital Stay
Admit: 2023-01-26 | Discharge: 2023-01-27 | Disposition: A | Discharge status: Home / Self Care | Payer: MEDICAID | Attending: Emergency Medicine

## 2023-01-26 DIAGNOSIS — R102 Pelvic and perineal pain: Secondary | ICD-10-CM

## 2023-01-26 LAB — COMPREHENSIVE METABOLIC PANEL
ALT: 32 U/L (ref 10–49)
AST: 29 U/L (ref 0.0–33.9)
Albumin: 3.5 gm/dl (ref 3.4–5.0)
Alkaline Phosphatase: 104 U/L (ref 46–116)
Anion Gap: 6 mmol/L (ref 5–15)
BUN: 9 mg/dl (ref 9–23)
CO2: 30 mEq/L (ref 20–31)
Calcium: 9.6 mg/dl (ref 8.7–10.4)
Chloride: 104 mEq/L (ref 98–107)
Creatinine: 0.92 mg/dl (ref 0.55–1.02)
GFR African American: >60
GFR Non-African American: >60
Glucose: 125 mg/dl — ABNORMAL HIGH (ref 74–106)
Potassium: 4.2 mEq/L (ref 3.5–5.1)
Sodium: 140 mEq/L (ref 136–145)
Total Bilirubin: 0.7 mg/dl (ref 0.30–1.20)
Total Protein: 7.4 gm/dl (ref 5.7–8.2)

## 2023-01-26 LAB — CBC WITH AUTO DIFFERENTIAL
Basophils: 1 % (ref 0–3)
Eosinophils: 1.9 % (ref 0–5)
Hematocrit: 39.7 % (ref 35.0–47.0)
Hemoglobin: 12.8 gm/dl (ref 11.0–16.0)
Immature Granulocytes: 0.6 % (ref 0.0–3.0)
Lymphocytes: 31.4 % (ref 28–48)
MCH: 27.9 pg (ref 25.4–34.6)
MCHC: 32.2 gm/dl (ref 30.0–36.0)
MCV: 86.5 fL (ref 80.0–98.0)
MPV: 8.9 fL (ref 6.0–10.0)
Monocytes: 5.1 % (ref 1–13)
Neutrophils Segmented: 60 % (ref 34–64)
Nucleated RBCs: 0 (ref 0–0)
Platelets: 242 10*3/uL (ref 140–450)
RBC: 4.59 M/uL (ref 3.60–5.20)
RDW: 42.3 (ref 36.4–46.3)
WBC: 9.9 10*3/uL (ref 4.0–11.0)

## 2023-01-26 LAB — POCT URINALYSIS DIPSTICK
Bilirubin, Urine: NEGATIVE
Glucose, Ur: NEGATIVE mg/dl
Ketones, Urine: NEGATIVE mg/dl
Nitrite, Urine: NEGATIVE
Protein, Urine: NEGATIVE mg/dl
Specific Gravity, Urine: 1.02 (ref 1.005–1.030)
Urobilinogen, Urine: 0.2 EU/dl (ref 0.0–1.0)
pH, Urine: 7 (ref 5–9)

## 2023-01-26 LAB — MICROSCOPIC URINALYSIS

## 2023-01-26 LAB — LIPASE: Lipase: 35 U/L (ref 12–53)

## 2023-01-26 MED ORDER — FLUCONAZOLE 150 MG PO TABS
150 MG | ORAL_TABLET | Freq: Once | ORAL | 1 refills | Status: AC
Start: 2023-01-26 — End: 2023-01-26

## 2023-01-26 MED ORDER — IOPAMIDOL 61 % IV SOLN
61 % | Freq: Once | INTRAVENOUS | Status: AC | PRN
Start: 2023-01-26 — End: 2023-01-26
  Administered 2023-01-26: 23:00:00 85 mL via INTRAVENOUS

## 2023-01-26 MED ORDER — OXYCODONE-ACETAMINOPHEN 5-325 MG PO TABS
5-325 MG | ORAL_TABLET | Freq: Four times a day (QID) | ORAL | 0 refills | Status: AC | PRN
Start: 2023-01-26 — End: 2023-01-29

## 2023-01-26 MED FILL — ISOVUE-300 61 % IV SOLN: 61 % | INTRAVENOUS | Qty: 85

## 2023-01-26 NOTE — ED Triage Notes (Signed)
-  pt c/o pelvic pain and blood in urine s/p hysterectomy 2 weeks. Told to come in and be seen.

## 2023-01-26 NOTE — Discharge Instructions (Signed)
Follow-up with your OB in 1 to 2 days.  Dr. Golden Circle.  Follow-up with the ER for any new or worsening symptoms

## 2023-01-26 NOTE — ED Provider Notes (Signed)
[facility]  Emergency Department Treatment Report        Patient: Diana Salazar Age: 47 y.o. Sex: female    Date of Birth: January 10, 1976 Admit Date: 01/26/2023 PCP: None, None   MRN: [id number]  CSN: [id number]     Room: H07/H07 Time Dictated: 6:46 PM            Chief Complaint   Chief Complaint   Patient presents with    Pelvic Pain    Hematuria       History of Present Illness   This is a 47 y.o. female past medical history of endometriosis sleep apnea fibroids who presents with pelvic pain and hematuria.  Patient had a total vaginal hysterectomy done by Dr. Stana Bunting 2 weeks ago.  Since then she initially had some hematuria for which she was placed on Augmentin 3 times daily.  Following that she is developed some pelvic pain as well as pain with defecation that has not improved and is continuing despite the Augmentin.  She spoke to Dr. Stana Bunting who advised that she come in she might need some imaging.  Patient feels nauseated when she urinates and has chills    Review of Systems   As outlined in HPI    Past Medical/Surgical History       Past Medical History:   Diagnosis Date    Anal fissure     Back pain     Condyloma     COVID 05/2021    MODERATE FLU SYMPTOMS    Depression     Endometriosis     Obesity     Sleep apnea     does not use cpap    Uterine fibroid     Vulvar cancer ([place])      Past Surgical History:   Procedure Laterality Date    CESAREAN SECTION      CONDYLOMA EXCISION N/A 10/05/2022    EXAM UNDER ANESTHESIA; EXCISION & FULGURATION OF ANAL CONDYLOMA performed by Malon Kindle, MD at [place] Bilateral     cataracts    HYSTERECTOMY, VAGINAL N/A 01/10/2023    TOTAL VAGINAL HYSTERECTOMY performed by Curly Shores I, MD at [facility] MAIN OR    OTHER SURGICAL HISTORY  03/13/2013    right radical hemivulvectomy,right inguinal node dissection    SALPINGECTOMY Bilateral     due to ectopic pregnancy x2    SALPINGO-OOPHORECTOMY  2012    LAPAROSCOPIC       Social History     Social  History     Socioeconomic History    Marital status: Single     Spouse name: Not on file    Number of children: Not on file    Years of education: Not on file    Highest education level: Not on file   Occupational History    Not on file   Tobacco Use    Smoking status: Every Day     Current packs/day: 0.50     Average packs/day: 0.5 packs/day for 30.0 years (15.0 ttl pk-yrs)     Types: Cigarettes    Smokeless tobacco: Never   Vaping Use    Vaping Use: Never used   Substance and Sexual Activity    Alcohol use: Yes     Comment: 2-3 DRINKS/YEAR    Drug use: No    Sexual activity: Not on file   Other Topics Concern    Not on file   Social History  Narrative    Not on file     Social Determinants of Health     Financial Resource Strain: Not on file   Food Insecurity: Not on file   Transportation Needs: Not on file   Physical Activity: Not on file   Stress: Not on file   Social Connections: Not on file   Intimate Partner Violence: Not on file   Housing Stability: Not on file       Family History     Family History   Problem Relation Age of Onset    No Known Problems Mother     No Known Problems Father        Current Medications     No current facility-administered medications for this encounter.     Current Outpatient Medications   Medication Sig Dispense Refill    naproxen (NAPROSYN) 250 MG tablet Take 1 tablet by mouth 3 times daily as needed for Pain 60 tablet 1    diphenhydrAMINE (SOMINEX) 25 MG tablet Take 2 tablets by mouth nightly as needed for Sleep         Allergies     Allergies   Allergen Reactions    Codeine Itching    Ibuprofen Swelling     Leg swelling  Leg swelling      Prednisone      Other reaction(s): other/intolerance  Other reaction(s): other/intolerance  Pt states it makes her "angry"  Pt states it makes her "angry"      Sulfa Antibiotics     Sulfamethoxazole-Trimethoprim Itching       Physical Exam   Patient Vitals for the past 24 hrs:   Temp Pulse Resp BP SpO2   01/26/23 1634 98.8 F (37.1 C) 77 --  133/80 97 %   01/26/23 1415 97.9 F (36.6 C) 88 18 139/83 100 %     Physical Exam  Constitutional:       Appearance: Normal appearance.   HENT:      Head: Normocephalic and atraumatic.      Nose: Nose normal.      Mouth/Throat:      Mouth: Mucous membranes are moist.   Eyes:      Conjunctiva/sclera: Conjunctivae normal.      Pupils: Pupils are equal, round, and reactive to light.   Cardiovascular:      Rate and Rhythm: Normal rate.   Pulmonary:      Effort: Pulmonary effort is normal.   Abdominal:      General: Abdomen is flat.   Musculoskeletal:         General: Normal range of motion.      Cervical back: Normal range of motion and neck supple.   Skin:     General: Skin is warm and dry.      Capillary Refill: Capillary refill takes less than 2 seconds.   Neurological:      General: No focal deficit present.      Mental Status: She is alert.   Psychiatric:         Mood and Affect: Mood normal.         Thought Content: Thought content normal.           Impression and Management Plan   RECORDS REVIEWED:  I reviewed the patient's previous records here at [facility] and available outside facilities and note that neither seen here on 115 by Dr. Stana Bunting for total vaginal hysterectomy.  Not seen in the ER recently    EXTERNAL  RESULTS REVIEWED: Followed outpatient by Dr. Stana Bunting as well as Dr. Marcelina Morel    INDEPENDENT HISTORIAN:  History and/or plan development assisted by: Patient    Severe exacerbation or progression of chronic illness: Bleeding and pain      Threat to body function without evaluation and management:  Loss of life or limb      SOCIAL DETERMINANTS  impacting Evaluation and Management:      Comorbidities impacting Evaluation and Management: None    Differential diagnoses UTI abscess bleeding    Initial impression: Patient presents with pain and bleeding following surgery will check CT discussed with OB    Diagnostic Studies   Lab:   Results for orders placed or performed during the hospital encounter of 01/26/23    CBC with Diff   Result Value Ref Range    WBC 9.9 4.0 - 11.0 1000/mm3    RBC 4.59 3.60 - 5.20 M/uL    Hemoglobin 12.8 11.0 - 16.0 gm/dl    Hematocrit 39.7 35.0 - 47.0 %    MCV 86.5 80.0 - 98.0 fL    MCH 27.9 25.4 - 34.6 pg    MCHC 32.2 30.0 - 36.0 gm/dl    Platelets 242 140 - 450 1000/mm3    MPV 8.9 6.0 - 10.0 fL    RDW 42.3 36.4 - 46.3      Nucleated RBCs 0 0 - 0      Immature Granulocytes 0.6 0.0 - 3.0 %    Neutrophils Segmented 60.0 34 - 64 %    Lymphocytes 31.4 28 - 48 %    Monocytes 5.1 1 - 13 %    Eosinophils 1.9 0 - 5 %    Basophils 1.0 0 - 3 %   CMP   Result Value Ref Range    Potassium 4.2 3.5 - 5.1 mEq/L    Chloride 104 98 - 107 mEq/L    Sodium 140 136 - 145 mEq/L    CO2 30 20 - 31 mEq/L    Glucose 125 (H) 74 - 106 mg/dl    BUN 9 9 - 23 mg/dl    Creatinine 0.92 0.55 - 1.02 mg/dl    GFR African American >60.0      GFR Non-African American >60      Calcium 9.6 8.7 - 10.4 mg/dl    Anion Gap 6 5 - 15 mmol/L    AST 29.0 0.0 - 33.9 U/L    ALT 32 10 - 49 U/L    Alkaline Phosphatase 104 46 - 116 U/L    Total Bilirubin 0.70 0.30 - 1.20 mg/dl    Total Protein 7.4 5.7 - 8.2 gm/dl    Albumin 3.5 3.4 - 5.0 gm/dl   Lipase   Result Value Ref Range    Lipase 35 12 - 53 U/L   Microscopic Urinalysis   Result Value Ref Range    WBC, UA OCCASIONAL /HPF    MUCUS, URINE PRESENT      BACTERIA, URINE OCCASIONAL /HPF   POCT Urinalysis no Micro   Result Value Ref Range    Glucose, Ur Negative NEGATIVE,Negative mg/dl    Bilirubin, Urine Negative NEGATIVE,Negative      Ketones, Urine Negative NEGATIVE,Negative mg/dl    Specific Gravity, Urine 1.020 1.005 - 1.030      Blood, Urine Small (A) NEGATIVE,Negative      pH, Urine 7.0 5 - 9      Protein, Urine Negative NEGATIVE,Negative mg/dl    Urobilinogen, Urine  0.2 0.0 - 1.0 EU/dl    Nitrite, Urine Negative NEGATIVE,Negative      Leukocyte Esterase, Urine Small (A) NEGATIVE,Negative      Color, UA Yellow      Clarity, UA Clear       Imaging:    CT PELVIS W CONTRAST Additional Contrast?  None   Final Result   IMPRESSION: Hysterectomy. No acute abnormality.      Electronically signed by: Debby Bud, MD 01/26/2023 6:21 PM EST             Workstation ID: [id number]           Imaging: Based on my personal interpretation, CT does not show any significant inflammatory process    Other studies:  My interpretation of other studies is that they show, among other things, UA with small leuks no treatment pending culture CBC unremarkable no shift no bands chemistry noted normal    ED Course         Critical Care Time (if necessary)     Medications   iopamidol (ISOVUE-300) 61 % injection 85 mL (85 mLs IntraVENous Given 01/26/23 1815)           NARRATIVE:  No explanation for patient's pain in the CT certainly does not mean that she is not having pain but there is no indication for antibiotics or other procedures at this time.  Will discharge for outpatient follow-up discussed this with her we will prescribe her some pain meds patient does not as an aside she is also having a yeast infection from the urine antibiotics so we will prescribe Diflucan      Medical Decision Making   As above      Final Diagnosis       ICD-10-CM    1. Pelvic pain  R10.2       2. Yeast infection  B37.9               Disposition   dc      Magda Kiel, MD  January 26, 2023    My signature above authenticates this document and my orders, the final    diagnosis (es), discharge prescription (s), and instructions in the Epic    record.  If you have any questions please contact [phone]     Nursing notes have been reviewed by the physician/ advanced practice    Clinician.       Jacinto Reap, MD  01/26/23 [phone]

## 2023-01-27 LAB — CULTURE, URINE: Isolate: <10000 — AB

## 2023-09-06 NOTE — H&P (Signed)
 Pre-Admission History and Physical    Patient: Diana Salazar   MRN: [id number]   SSN: [id number]   Date of Birth: [birth year]   Age: 47 y.o.   Sex: female     Patient scheduled for: left lumbar four/five, lumbar five/sacral one laminectomy with tubular retractor.  Date of surgery: 09/09/2023.  Surgeon: Oneil WENDI March, MD    HPI:  Diana Salazar is a 47 y.o. female with back and radiating left leg pain for 3 years. She has been through various treatments which gave her only a few days of relief. MRI demonstrates degenerative changes at L4/5 and L5/S1 with left paracentral synovitis with synovial cyst formation causing lateral recess and foraminal stenosis. This patient has failed the presurgical conservative treatments  including physical therapy, spinal block injections and medications. Pain has impacted the patient's functional ability. She is being admitted for surgical intervention.         Past Medical History:   Diagnosis Date    Anal fissure     Back pain     Depression     Obesity     OSA (obstructive sleep apnea)     NO CPAP    Vulvar cancer (HCC) 2014     Social History     Socioeconomic History    Marital status: Single     Spouse name: None    Number of children: None    Years of education: None    Highest education level: None   Tobacco Use    Smoking status: Every Day     Average packs/day: 0.5 packs/day for 30.0 years (15.0 ttl pk-yrs)     Types: Cigarettes     Start date: 56    Smokeless tobacco: Never   Vaping Use    Vaping status: Never Used   Substance and Sexual Activity    Alcohol use: Yes     Comment: Rare    Drug use: Never     Past Surgical History:   Procedure Laterality Date    CATARACT REMOVAL Bilateral 2016    CESAREAN SECTION  2003    2009    COLONOSCOPY      CONDYLOMA EXCISION N/A 10/05/2022    EXAM UNDER ANESTHESIA; EXCISION & FULGURATION OF ANAL CONDYLOMA performed by Gisele Rogue, MD at [facility] MAIN OR    HYSTERECTOMY, VAGINAL N/A 01/10/2023    TOTAL VAGINAL HYSTERECTOMY  performed by Curt Deward FERNS, MD at [facility] MAIN OR    OTHER SURGICAL HISTORY  03/13/2013    right radical hemivulvectomy,right inguinal node dissection    SALPINGECTOMY Bilateral     due to ectopic pregnancy x2     Family History   Problem Relation Age of Onset    No Known Problems Mother     No Known Problems Father      Allergies   Allergen Reactions    Sulfa Antibiotics Hives and Itching    Codeine Itching    Ibuprofen Swelling     Leg swelling        Prednisone Other (See Comments)     Extreme anger     No current facility-administered medications for this encounter.     Current Outpatient Medications   Medication Sig Dispense Refill    gabapentin (NEURONTIN) 300 MG capsule Take 1 capsule by mouth 3 times daily. Max Daily Amount: 900 mg      diphenhydrAMINE  (SOMINEX) 25 MG tablet Take 2 tablets by mouth nightly as needed for Sleep  ROS:  Denies chills, fever,night sweats,  bowel or bladder dysfunction, unexplained weight loss/weight gain, chest pain, sob or anxiety.    Physical Examination    Gen: Well developed, well nourished 47 y.o. female with positive SLR on the left. Antalgic ROM of the lumbar spine. Supple neck, normal strength of the deltoids, biceps, triceps, intrinsics, ehl, ta, quads. No babinski, hoffmans, clonus.    Assessment and Plan    Due to the pt's persistent symptoms unrelieved by conservative measure Diana Salazar is being admitted to undergo surgical intervention. The post-operative plan of care consists of physical therapy, home health and a 2 week f/u office visit.  The risks, benefits, complications and alternatives to surgery have been discussed in detail with the patient.  The patient understands and agrees to proceed.

## 2023-09-09 ENCOUNTER — Ambulatory Visit: Admit: 2023-09-09 | Payer: MEDICAID

## 2023-09-09 ENCOUNTER — Inpatient Hospital Stay: Payer: Medicaid (Managed Care) | Attending: Orthopaedic Surgery

## 2023-09-09 MED ORDER — LACTATED RINGERS IV SOLN
INTRAVENOUS | Status: DC
Start: 2023-09-09 — End: 2023-09-09

## 2023-09-09 MED ORDER — HYDROMORPHONE HCL 2 MG/ML IJ SOLN
2 | INTRAMUSCULAR | Status: AC
Start: 2023-09-09 — End: ?

## 2023-09-09 MED ORDER — TRANEXAMIC ACID 1000 MG/10ML IV SOLN
100010 MG/10ML | Freq: Once | INTRAVENOUS | Status: AC
Start: 2023-09-09 — End: 2023-09-09
  Administered 2023-09-09 (×2): 1000 mg via INTRAVENOUS

## 2023-09-09 MED ORDER — THROMBIN 5000 UNITS EX SOLR
5000 | CUTANEOUS | Status: DC | PRN
Start: 2023-09-09 — End: 2023-09-09

## 2023-09-09 MED ORDER — MIDAZOLAM HCL 2 MG/2ML IJ SOLN
2 | Freq: Once | INTRAMUSCULAR | Status: DC | PRN
Start: 2023-09-09 — End: 2023-09-09

## 2023-09-09 MED ORDER — DEXAMETHASONE 4 MG/ML IJ SOLN (MIXTURES ONLY)
4 | Freq: Once | INTRAMUSCULAR | Status: DC | PRN
Start: 2023-09-09 — End: 2023-09-09

## 2023-09-09 MED ORDER — ONDANSETRON HCL 4 MG/2ML IJ SOLN
4 | INTRAMUSCULAR | Status: AC
Start: 2023-09-09 — End: ?

## 2023-09-09 MED ORDER — FENTANYL CITRATE (PF) 100 MCG/2ML IJ SOLN
100 | Freq: Once | INTRAMUSCULAR | Status: DC | PRN
Start: 2023-09-09 — End: 2023-09-09

## 2023-09-09 MED ORDER — OXYCODONE HCL 5 MG PO TABS
5 | ORAL_TABLET | Freq: Four times a day (QID) | ORAL | 0 refills | Status: AC | PRN
Start: 2023-09-09 — End: 2023-09-16

## 2023-09-09 MED ORDER — PREGABALIN 75 MG PO CAPS
75 | Freq: Two times a day (BID) | ORAL | Status: DC
Start: 2023-09-09 — End: 2023-09-09

## 2023-09-09 MED ORDER — LIDOCAINE HCL (PF) 1 % IJ SOLN
1 | Freq: Once | INTRAMUSCULAR | Status: DC | PRN
Start: 2023-09-09 — End: 2023-09-09

## 2023-09-09 MED ORDER — GELATIN ABSORBABLE 100 CM EX MISC
100 | CUTANEOUS | Status: AC
Start: 2023-09-09 — End: ?

## 2023-09-09 MED ORDER — LABETALOL HCL 20 MG/4ML IV SOSY
20 | INTRAVENOUS | Status: DC | PRN
Start: 2023-09-09 — End: 2023-09-09

## 2023-09-09 MED ORDER — SODIUM CHLORIDE (PF) 0.9 % IJ SOLN
0.9 | INTRAMUSCULAR | Status: DC | PRN
Start: 2023-09-09 — End: 2023-09-09

## 2023-09-09 MED ORDER — PREGABALIN 75 MG PO CAPS
75 | Freq: Once | ORAL | Status: AC
Start: 2023-09-09 — End: 2023-09-09

## 2023-09-09 MED ORDER — KETAMINE HCL 100 MG/ML IJ SOLN
100 | Freq: Once | INTRAMUSCULAR | Status: DC | PRN
Start: 2023-09-09 — End: 2023-09-09

## 2023-09-09 MED ORDER — THROMBIN 5000 UNITS EX SOLR
5000 | CUTANEOUS | Status: AC
Start: 2023-09-09 — End: ?

## 2023-09-09 MED ORDER — SUGAMMADEX SODIUM 200 MG/2ML IV SOLN
200 | INTRAVENOUS | Status: AC
Start: 2023-09-09 — End: ?

## 2023-09-09 MED ORDER — ONDANSETRON HCL 4 MG/2ML IJ SOLN
4 | Freq: Once | INTRAMUSCULAR | Status: DC | PRN
Start: 2023-09-09 — End: 2023-09-09

## 2023-09-09 MED ORDER — DIPHENHYDRAMINE HCL 50 MG/ML IJ SOLN
50 | Freq: Once | INTRAMUSCULAR | Status: AC
Start: 2023-09-09 — End: 2023-09-09

## 2023-09-09 MED ORDER — PROPOFOL BOLUS 10 MG/ML SOLN (WRAPPER)
10 | Freq: Once | INTRAVENOUS | Status: DC | PRN
Start: 2023-09-09 — End: 2023-09-09

## 2023-09-09 MED ORDER — HYDROMORPHONE HCL 2 MG/ML IJ SOLN
2 | Freq: Once | INTRAMUSCULAR | Status: DC | PRN
Start: 2023-09-09 — End: 2023-09-09

## 2023-09-09 MED ORDER — KETOROLAC TROMETHAMINE 15 MG/ML IJ SOLN
15 | Freq: Once | INTRAMUSCULAR | Status: DC | PRN
Start: 2023-09-09 — End: 2023-09-09

## 2023-09-09 MED ORDER — DEXMEDETOMIDINE HCL 200 MCG/2ML IV SOLN
200 | Freq: Once | INTRAVENOUS | Status: DC | PRN
Start: 2023-09-09 — End: 2023-09-09

## 2023-09-09 MED ORDER — ROCURONIUM BROMIDE 50 MG/5ML IV SOLN
50 | Freq: Once | INTRAVENOUS | Status: DC | PRN
Start: 2023-09-09 — End: 2023-09-09

## 2023-09-09 MED ORDER — STERILE WATER FOR INJECTION (MIXTURES ONLY)
1 | Freq: Once | INTRAMUSCULAR | Status: AC
Start: 2023-09-09 — End: 2023-09-09

## 2023-09-09 MED ORDER — BUPIVACAINE HCL (PF) 0.5 % IJ SOLN
0.5 | INTRAMUSCULAR | Status: AC
Start: 2023-09-09 — End: ?

## 2023-09-09 MED ORDER — FAMOTIDINE 20 MG PO TABS
20 | Freq: Once | ORAL | Status: AC
Start: 2023-09-09 — End: 2023-09-09

## 2023-09-09 MED ORDER — KETOROLAC TROMETHAMINE 15 MG/ML IJ SOLN
15 | INTRAMUSCULAR | Status: AC
Start: 2023-09-09 — End: ?

## 2023-09-09 MED ORDER — KETAMINE HCL 50 MG/ML IJ SOSY
50 | INTRAMUSCULAR | Status: AC
Start: 2023-09-09 — End: ?

## 2023-09-09 MED ORDER — OXYCODONE HCL 5 MG PO TABS
5 | Freq: Once | ORAL | Status: AC | PRN
Start: 2023-09-09 — End: 2023-09-09

## 2023-09-09 MED ORDER — VANCOMYCIN HCL 1 G IV SOLR
1 | INTRAVENOUS | Status: DC | PRN
Start: 2023-09-09 — End: 2023-09-09

## 2023-09-09 MED ORDER — SODIUM CHLORIDE 0.9 % IV SOLN (MINI-BAG)
0.9 | Freq: Once | INTRAVENOUS | Status: DC
Start: 2023-09-09 — End: 2023-09-09

## 2023-09-09 MED ORDER — ACETAMINOPHEN 500 MG PO TABS
500 | Freq: Once | ORAL | Status: AC
Start: 2023-09-09 — End: 2023-09-09

## 2023-09-09 MED ORDER — VANCOMYCIN HCL 1 G IV SOLR
1 | INTRAVENOUS | Status: AC
Start: 2023-09-09 — End: ?

## 2023-09-09 MED ORDER — LIDOCAINE-EPINEPHRINE 2 %-1:100000 IJ SOLN
INTRAMUSCULAR | Status: AC
Start: 2023-09-09 — End: ?

## 2023-09-09 MED ORDER — SUGAMMADEX SODIUM 200 MG/2ML IV SOLN
200 | Freq: Once | INTRAVENOUS | Status: DC | PRN
Start: 2023-09-09 — End: 2023-09-09

## 2023-09-09 MED ORDER — FENTANYL CITRATE (PF) 100 MCG/2ML IJ SOLN
100 | INTRAMUSCULAR | Status: DC | PRN
Start: 2023-09-09 — End: 2023-09-09

## 2023-09-09 MED ORDER — FENTANYL CITRATE (PF) 100 MCG/2ML IJ SOLN
100 | INTRAMUSCULAR | Status: AC
Start: 2023-09-09 — End: ?

## 2023-09-09 MED ORDER — MIDAZOLAM HCL 2 MG/2ML IJ SOLN
2 | INTRAMUSCULAR | Status: AC
Start: 2023-09-09 — End: ?

## 2023-09-09 MED ORDER — ACETAMINOPHEN 325 MG PO TABS
325 | Freq: Once | ORAL | Status: DC
Start: 2023-09-09 — End: 2023-09-09

## 2023-09-09 MED ORDER — BUPIVACAINE HCL (PF) 0.5 % IJ SOLN
0.5 | INTRAMUSCULAR | Status: DC | PRN
Start: 2023-09-09 — End: 2023-09-09

## 2023-09-09 MED ORDER — HYDROMORPHONE 0.5MG/0.5ML IJ SOLN
1 | Status: DC | PRN
Start: 2023-09-09 — End: 2023-09-09

## 2023-09-09 MED ORDER — GELATIN ABSORBABLE 100 EX MISC
100 | CUTANEOUS | Status: DC | PRN
Start: 2023-09-09 — End: 2023-09-09

## 2023-09-09 MED ADMIN — sugammadex (BRIDION) 200 MG/2ML injection: 200 | INTRAVENOUS | @ 17:00:00 | NDC 00006542312

## 2023-09-09 MED ADMIN — ketorolac (TORADOL) injection: 15 | INTRAVENOUS | @ 17:00:00 | NDC 63323016100

## 2023-09-09 MED ADMIN — lactated ringers IV soln infusion: INTRAVENOUS | @ 15:00:00 | NDC 00409795309

## 2023-09-09 MED ADMIN — rocuronium (ZEMURON) injection: 20 | INTRAVENOUS | @ 16:00:00 | NDC 43547053001

## 2023-09-09 MED ADMIN — diphenhydrAMINE (BENADRYL) injection 25 mg: 25 mg | INTRAVENOUS | @ 18:00:00 | NDC 72485010101

## 2023-09-09 MED ADMIN — thrombin external solution 5000 units: 10000 | TOPICAL | @ 16:00:00 | NDC 60793021505

## 2023-09-09 MED ADMIN — acetaminophen (TYLENOL) tablet 1,000 mg: 1000 mg | ORAL | @ 13:00:00 | NDC 00904673061

## 2023-09-09 MED ADMIN — ketamine (KETALAR) injection: 25 | INTRAVENOUS | @ 16:00:00 | NDC 00409205105

## 2023-09-09 MED ADMIN — dexmedeTOMIDine (PRECEDEX) injection: 6 | INTRAVENOUS | @ 16:00:00 | NDC 55150020902

## 2023-09-09 MED ADMIN — midazolam (VERSED) injection: 2 | INTRAVENOUS | @ 15:00:00 | NDC 72611074101

## 2023-09-09 MED ADMIN — fentaNYL (SUBLIMAZE) injection: 50 | INTRAVENOUS | @ 16:00:00 | NDC 00641602701

## 2023-09-09 MED ADMIN — ondansetron (ZOFRAN) injection: 4 | INTRAVENOUS | @ 17:00:00 | NDC 23155054731

## 2023-09-09 MED ADMIN — HYDROmorphone (DILAUDID) injection: 2 | INTRAVENOUS | @ 17:00:00 | NDC 76045001001

## 2023-09-09 MED ADMIN — rocuronium (ZEMURON) injection: 30 | INTRAVENOUS | @ 16:00:00 | NDC 43547053001

## 2023-09-09 MED ADMIN — oxyCODONE (ROXICODONE) immediate release tablet 5 mg: 5 mg | ORAL | @ 19:00:00 | NDC 00406055223

## 2023-09-09 MED ADMIN — propofol bolus: 50 | INTRAVENOUS | @ 16:00:00 | NDC 63323026922

## 2023-09-09 MED ADMIN — pregabalin (LYRICA) capsule 75 mg: 75 mg | ORAL | @ 13:00:00 | NDC 00904700061

## 2023-09-09 MED ADMIN — dexmedeTOMIDine (PRECEDEX) injection: 4 | INTRAVENOUS | @ 16:00:00 | NDC 55150020902

## 2023-09-09 MED ADMIN — dexmedeTOMIDine (PRECEDEX) injection: 10 | INTRAVENOUS | @ 16:00:00 | NDC 55150020902

## 2023-09-09 MED ADMIN — dexAMETHasone (DECADRON) injection: 8 | INTRAVENOUS | @ 16:00:00 | NDC 00641614501

## 2023-09-09 MED ADMIN — ceFAZolin (ANCEF) 3,000 mg in sterile water 30 mL IV Syringe: 3000 mg | INTRAVENOUS | @ 16:00:00 | NDC 63323018520

## 2023-09-09 MED ADMIN — famotidine (PEPCID) tablet 20 mg: 20 mg | ORAL | @ 13:00:00 | NDC 00904719361

## 2023-09-09 MED ADMIN — vancomycin (VANCOCIN) injection: 1000 | TOPICAL | @ 17:00:00 | NDC 00409653311

## 2023-09-09 MED ADMIN — gelatin adsorbable (GELFOAM) sponge: 1 | @ 16:00:00 | NDC 00009034201

## 2023-09-09 MED ADMIN — BUPivacaine (PF) (MARCAINE) 0.5 % 20 mL, lidocaine-EPINEPHrine 2%-1:100000 20 mL: 40 | INTRAMUSCULAR | @ 17:00:00 | NDC 63323048303

## 2023-09-09 MED FILL — CEFAZOLIN SODIUM 1 G IJ SOLR: 1 g | INTRAMUSCULAR | Qty: 3000

## 2023-09-09 MED FILL — MIDAZOLAM HCL 2 MG/2ML IJ SOLN: 2 MG/ML | INTRAMUSCULAR | Qty: 2

## 2023-09-09 MED FILL — VANCOMYCIN HCL 1 G IV SOLR: 1 g | INTRAVENOUS | Qty: 1000

## 2023-09-09 MED FILL — OXYCODONE HCL 5 MG PO TABS: 5 MG | ORAL | Qty: 1

## 2023-09-09 MED FILL — SENSORCAINE-MPF 0.5 % IJ SOLN: 0.5 % | INTRAMUSCULAR | Qty: 30

## 2023-09-09 MED FILL — THROMBIN-JMI 5000 UNITS EX SOLR: 5000 units | CUTANEOUS | Qty: 5000

## 2023-09-09 MED FILL — BRIDION 200 MG/2ML IV SOLN: 200 MG/2ML | INTRAVENOUS | Qty: 4

## 2023-09-09 MED FILL — PREGABALIN 75 MG PO CAPS: 75 MG | ORAL | Qty: 1

## 2023-09-09 MED FILL — ACETAMINOPHEN 325 MG PO TABS: 325 MG | ORAL | Qty: 2

## 2023-09-09 MED FILL — DILAUDID 2 MG/ML IJ SOLN: 2 MG/ML | INTRAMUSCULAR | Qty: 1

## 2023-09-09 MED FILL — ONDANSETRON HCL 4 MG/2ML IJ SOLN: 4 MG/2ML | INTRAMUSCULAR | Qty: 2

## 2023-09-09 MED FILL — GELFOAM COMPRESSED SIZE 100 EX MISC: CUTANEOUS | Qty: 6

## 2023-09-09 MED FILL — FENTANYL CITRATE (PF) 100 MCG/2ML IJ SOLN: 100 MCG/2ML | INTRAMUSCULAR | Qty: 2

## 2023-09-09 MED FILL — KETOROLAC TROMETHAMINE 15 MG/ML IJ SOLN: 15 MG/ML | INTRAMUSCULAR | Qty: 1

## 2023-09-09 MED FILL — LACTATED RINGERS IV SOLN: INTRAVENOUS | Qty: 1000

## 2023-09-09 MED FILL — TRANEXAMIC ACID 1000 MG/10ML IV SOLN: 1000 MG/10ML | INTRAVENOUS | Qty: 10

## 2023-09-09 MED FILL — ACETAMINOPHEN EXTRA STRENGTH 500 MG PO TABS: 500 MG | ORAL | Qty: 2

## 2023-09-09 MED FILL — FAMOTIDINE 20 MG PO TABS: 20 MG | ORAL | Qty: 1

## 2023-09-09 MED FILL — XYLOCAINE/EPINEPHRINE 2 %-1:100000 IJ SOLN: 2 %-1:100000 | INTRAMUSCULAR | Qty: 20

## 2023-09-09 MED FILL — KETAMINE HCL 50 MG/ML IJ SOSY: 50 MG/ML | INTRAMUSCULAR | Qty: 1

## 2023-09-09 MED FILL — DIPHENHYDRAMINE HCL 50 MG/ML IJ SOLN: 50 MG/ML | INTRAMUSCULAR | Qty: 1

## 2023-09-09 NOTE — Interval H&P Note (Signed)
 Update History & Physical    The patient's History and Physical of September 06, 2023 was reviewed with the patient and I examined the patient. There was no change. The surgical site was confirmed by the patient and me.     Plan: The risks, benefits, expected outcome, and alternative to the recommended procedure have been discussed with the patient. Patient understands and wants to proceed with the procedure.     Electronically signed by ONEIL KATHEE MARCH, MD on 09/09/2023 at 9:34 AM

## 2023-09-09 NOTE — Brief Op Note (Signed)
 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]    Post-Op Diagnosis: Same       Procedure(s):  LEFT LUMBAR FOUR/FIVE, LUMBAR FIVE/SACRAL ONE LAMINECTOMY, TUBULAR RETRACTOR; C-ARM    Surgeon(s):  Alaine Oneil NOVAK, MD    Assistant:  Surgical Assistant: Walker, Jordan    Anesthesia: General    Estimated Blood Loss (mL): Minimal    Complications: None    Specimens:   * No specimens in log *    Implants:  * No implants in log *      Drains: * No LDAs found *    Findings:  Infection Present At Time Of Surgery (PATOS) (choose all levels that have infection present):  No infection present  Other Findings: hnp stenosis compressing S1    Electronically signed by ONEIL NOVAK ALAINE, MD on 2023/09/27 at 12:39 PM

## 2023-09-09 NOTE — Op Note (Signed)
 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]    Post-Op Diagnosis: Same       Procedure L5 Sacral one left laminectomy discectomy    Surgeon(s):  Alaine Oneil NOVAK, MD    Assistant:   Surgical Assistant: Walker, Jordan    Anesthesia: General    Estimated Blood Loss (mL): Minimal    Complications: None    Specimens:   * No specimens in log *    Implants:  * No implants in log *      Drains: * No LDAs found *    Findings:  Infection Present At Time Of Surgery (PATOS) (choose all levels that have infection present):  No infection present  Other Findings: hnp/stenosis    Detailed Description of Procedure:   Following induction of general tracheal anesthesia patient was turned prone position spinal frame patient was prepped draped usual fashion morbid obesity made the surgery difficult.    Patient is prepped draped usual fashion 1 inch incision was made in the left paramedian spot at the lumbosacral junction spotted fluoroscopically to bring this down directly at the L5-S1 interval initial guide tube was placed.  320 mm to the bone.  Greater dilators were utilized and then the lighted retractor tubular retractor was placed.  C-arm images verified a surgical level L5-S1 interval was debrided the facet disc space define.  Hemilaminectomy was done with dissection of interval ligamentum flavum and medial facetectomy done till the S1 root was free.  Underlying discal mass partially ossified he was noted an annulotomy done grade curettes utilized to debride the disc space and the annulus removed and disc material removed into the nerve root was free and mobile.  I could palpate along the 5 and 1 roots without concern.  Gelfoam and thrombin  placed over the laminotomy site.    Wound was copiously irrigated  vancomycin  powder instilled for infection prophylaxis the wound was dry lumbodorsal fascia was closed with a #1 Vicryl subcutaneous tissues 2-0 Vicryl skin with a 3-0 Monocryl subcu suture and Dermabond a sterile dressing placed on the wound correct estimated blood loss minimal   complications none   specimens none   implants none   Anesthesia GEA  Electronically signed by ONEIL NOVAK ALAINE, MD on 2023/10/02 at 12:40 PM

## 2023-09-09 NOTE — Anesthesia Postprocedure Evaluation (Signed)
 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

## 2023-09-09 NOTE — Progress Notes (Signed)
Pt states she is itching, back, face, arms. Spokee with anesth and orders to follow.   Patient tolerating PO fluids.

## 2023-09-09 NOTE — Discharge Instructions (Addendum)
Dr. Herschell Dimes Post-Operative Instructions Thoracic/Lumbar/Sacral Spine Surgery    DO NOT take any NSAIDS if you had Fusion Surgery.     ACTIVITIES:  *The first week after surgery   Change positions every hour while you are awake.  Walking is the best way to rebuild strength.   Activities around the house, such as washing dishes and preparing light meals are fine.   Avoid strenuous activities, such as vacuuming, and do not lift anything heavier than 1 gallon of milk (or about 5-8 pounds).   Do not bend over to pick up items from the ground level until 3 months post-op.    *Week 2 and beyond  You may gradually increase your activities, but avoid heavy lifting, pushing/pulling.   Walk at a pace that avoids fatigue or severe pain. Do not try to walk several blocks the first day! As you increase the distance, you may feel tired. If so, stop and rest.   Follow-up with Dr. Carney Living will be 2 weeks after surgery.    BATHING and INCISION CARE:  The incision may be tender or feel numb: this is normal.   Keep the incision clean and dry. You may shower 3 days after surgery. Cover the dressing with saran wrap before getting in the shower. The incision is closed with sutures under the skin and glue on top.   Do not apply any lotions, ointments or oils on the incision.   Do not remove the dressing. Your dressing will be changed at your first post op appointment. If it comes loose or is damaged, dirty or wet before this appointment, call your home health nurse (if you are being seen by a nurse at home) or the office to have the dressing changed.  If you notice any excessive swelling, redness, or persistent drainage around the incision, notify the office immediately.    CONSTIPATION:  Take a stool softener twice a day while you are taking a narcotic.   If you have not had a bowel movement within 3 days of surgery, you will need to use a laxative or suppository that can be obtained over the counter at your local pharmacy     ICE  Use  ice on your back to decrease pain and swelling.  Do NOT use heat.    MEDICATIONS:  If you had fusion surgery DO NOT TAKE non-steroidal anti-inflammatory (NSAID) medications, such as Motrin, Aleve, Advil Naprosyn, Ibuprofen or aspirin.   Take Tylenol/Acetaminophen every 4-6 hours for pain. Do not take more than 3000 mg each day. (Do not take Tylenol/Acetaminophen if you have liver problems).  Take your prescribed narcotic pain medication as needed for pain that is not tolerable.    Eat food before you take any pain medication to avoid nausea.    If you need a medication refill, please call the office during working hours at least 2 days before your prescription runs out. Do not wait until your bottle is empty to call for a refill.         NUTRITION:  Eat healthy to help your wound to heal.    Eat a healthy balanced diet to help your wound to heal. Protein supplements should be considered if you are eating less than 50% of your meal.   Drink plenty of water to stay hydrated.    DRIVING & RETURN TO WORK:   You will be told at your follow up appointment if it is safe for you to drive or return to work and  will be provided with a return-to-work-note if needed (please ask).   NEVER drive while taking narcotic medication.    WHEN TO CALL THE OFFICE:  If you have severe pain unrelieved by the medications, new numbness or tingling in your legs;        If you have a fever of 101.41F or greater   If you notice increased swelling, redness, or increased drainage from the incision    If you are not able to urinate  If you are not able to control your bowels       [facility] number is [phone].  They are open from 8:00am to 5:00pm Mon - Fri. After 5:00pm, or on weekends/holidays, please call the answering service at [phone] for a call back.    After general anesthesia or intravenous sedation, for 24 hours or while taking prescription Narcotics:  Limit your activities  Do not drive and operate hazardous  machinery  Do not make important personal or business decisions  Do  not drink alcoholic beverages  If you have not urinated within 8 hours after discharge, please contact your surgeon on call.

## 2023-09-09 NOTE — Progress Notes (Addendum)
 Pt voided approx 150-200 ml clear yellow urine in bedpan.   BS completed and 132 retained PVR.   Pt is c/o left leg numbness to touch. Pt able to use leg as normal as evidenced by watching patient lift her bottom off the bed when adjusting in a supine position.   MD Alaine made aware.

## 2023-09-09 NOTE — Progress Notes (Signed)
 MD Carney Living at bedside

## 2023-09-09 NOTE — Anesthesia Pre Procedure (Signed)
 Department of Anesthesiology  Preprocedure Note       Name:  Diana Salazar   Age:  47 y.o.  DOB:  08-Apr-1976                                          MRN:  [id number]         Date:  09/09/2023      Surgeon: Clotilde):  Alaine Oneil NOVAK, MD    Procedure: Procedure(s):  LEFT LUMBAR FOUR/FIVE, LUMBAR FIVE/SACRAL ONE LAMINECTOMY, TUBULAR RETRACTOR; C-ARM   DOS: 01/10/2023  BMI: 47.0        PCP: Inocente Francois MD  Not been seen yet        Medications prior to admission:   Prior to Admission medications    Medication Sig Start Date End Date Taking? Authorizing Provider   oxyCODONE  (ROXICODONE ) 5 MG immediate release tablet Take 1 tablet by mouth every 6 hours as needed for Pain for up to 7 days. Intended supply: 7 days. Take lowest dose possible to manage pain Max Daily Amount: 20 mg 09/09/23 09/16/23 Yes Alaine Oneil NOVAK, MD   gabapentin (NEURONTIN) 300 MG capsule Take 1 capsule by mouth 3 times daily.   Yes [provider]   diphenhydrAMINE  (SOMINEX) 25 MG tablet Take 2 tablets by mouth nightly as needed for Sleep   Yes [provider]       Current medications:    Current Facility-Administered Medications   Medication Dose Route Frequency Provider Last Rate Last Admin   . ceFAZolin  (ANCEF ) 3,000 mg in sterile water  30 mL IV Syringe  3,000 mg IntraVENous Once Cog[place]ale, Claire B, PA-C       . tranexamic acid  (CYKLOKAPRON ) 1,000 mg in sodium chloride  0.9 % 110 mL IVPB (mini-bag)  1,000 mg IntraVENous Once Kerner, Mark B, MD       . tranexamic acid  (CYKLOKAPRON ) 1,000 mg in sodium chloride  0.9 % 110 mL IVPB (mini-bag)  1,000 mg IntraVENous Once Kerner, Mark B, MD       . lidocaine  PF 1 % injection 1 mL  1 mL IntraDERmal Once PRN Nathanial Nest, APRN - CRNA       . lactated ringers  IV soln infusion   IntraVENous Continuous Nathanial Nest, APRN - CRNA       . acetaminophen  (TYLENOL ) tablet 650 mg  650 mg Oral Once Nathanial Nest, APRN - CRNA           Allergies:    Allergies   Allergen Reactions   . Sulfa  Antibiotics Hives and Itching   . Codeine Itching   . Ibuprofen Swelling     Leg swelling       . Prednisone Other (See Comments)     Extreme anger       Problem List:    Patient Active Problem List   Diagnosis Code   . Vulvar cancer (HCC) C51.9   . Hemorrhage in uterus N93.9   . Abnormal uterine bleeding (AUB) N93.9   . Abnormal uterine bleeding N93.9       Past Medical History:        Diagnosis Date   . Anal fissure    . Back pain    . Depression    . Obesity    . OSA (obstructive sleep apnea)     NO CPAP   . Vulvar cancer (HCC) 2014  Past Surgical History:        Procedure Laterality Date   . CATARACT REMOVAL Bilateral 2016   . CESAREAN SECTION  2003    2009   . COLONOSCOPY     . CONDYLOMA EXCISION N/A 10/05/2022    EXAM UNDER ANESTHESIA; EXCISION & FULGURATION OF ANAL CONDYLOMA performed by Gisele Rogue, MD at [facility] MAIN OR   . HYSTERECTOMY, VAGINAL N/A 01/10/2023    TOTAL VAGINAL HYSTERECTOMY performed by Curt Deward FERNS, MD at [facility] MAIN OR   . OTHER SURGICAL HISTORY  03/13/2013    right radical hemivulvectomy,right inguinal node dissection   . SALPINGECTOMY Bilateral     due to ectopic pregnancy x2       Social History:    Social History     Tobacco Use   . Smoking status: Every Day     Average packs/day: 0.5 packs/day for 30.0 years (15.0 ttl pk-yrs)     Types: Cigarettes     Start date: 52   . Smokeless tobacco: Never   Substance Use Topics   . Alcohol use: Yes     Comment: Rare                                Ready to quit: Not Answered  Counseling given: Not Answered      Vital Signs (Current):   Vitals:    09/05/23 0901 09/09/23 0830 09/09/23 0900   BP:   116/76   Pulse:   64   Resp:   20   Temp:   98.1 F (36.7 C)   TempSrc:   Oral   SpO2:   93%   Weight: 131.5 kg (290 lb) (!) 136.6 kg (301 lb 3.2 oz)    Height: 1.626 m (5' 4)                                                BP Readings from Last 3 Encounters:   09/09/23 116/76   01/26/23 133/80   01/11/23 (!) 157/88       NPO Status: Time of  last liquid consumption: 2345                        Time of last solid consumption: 2345                        Date of last liquid consumption: 09/08/23                        Date of last solid food consumption: 09/08/23    BMI:   Wt Readings from Last 3 Encounters:   09/09/23 (!) 136.6 kg (301 lb 3.2 oz)   01/26/23 127.9 kg (282 lb)   01/10/23 125.4 kg (276 lb 7.3 oz)     Body mass index is 51.7 kg/m.       LABS TO BE DONE AT [facility] AND WILL BE ON THE PAPER CHART      CBC: CMP:     Type & Screen : 12/28/2022  Antibody Screen    NEG      ABO/Rh    O Rh Positive       COVID-19 Screening: + COVID 2022 -  moderate fever, flu like - has resolved      EKG 12/28/2022  Normal sinus rhythm   Anterior infarct , age undetermined   Abnormal ECG   No previous ECGs available       EKG 07/19/2021:  BORDERLINE ECG -     Impression  SR-Sinus rhythm-normal P axis, V-rate 50-99    Impression  ET-Abnormal R-wave progression, early transition-QRS area>0 in V2    Impression  REPB-Borderline repolarization abnormality-ST dep & abnormal T    Impression  -sinus-            ECHO 10/05/2022:  Narrative    CONCLUSIONS     * Left ventricular chamber size and wall thickness is normal.     * Left ventricular systolic function is normal with an ejection fraction of   61 % by Simpson's biplane.     * Left ventricular segmental wall motion is normal.     * Left ventricular diastolic function: normal.     * No hemodynamically significant valvular disease.     * There is trace tricuspid valve regurgitation.     * No pulmonary hypertension, estimated pulmonary arterial systolic pressure is 25 mmHg.     Comparison     * No prior study is available for comparison.       MYO Resistance 07/20/2021:  Narrative  CONCLUSIONS     * ECG response is negative.     * Transient ischemic dilatation absent.     * Stress SPECT tomographic images reveal homogeneous tracer uptake   throughout the myocardium. Rest images show no significant change.     * Post stress  left ventricular ejection fraction is normal, 70 %.     * Post stress images segmental wall thickening and motion are normal.     * Myocardial perfusion imaging is normal.     * This is a low risk scan.           Anesthesia Evaluation  Patient summary reviewed   no history of anesthetic complications:   Airway: Mallampati: III  TM distance: >3 FB   Neck ROM: full  Mouth opening: > = 3 FB   Dental: normal exam         Pulmonary:normal exam  breath sounds clear to auscultation  (+)     sleep apnea ( Does not have a CPAP currently): on noncompliant,       current smoker ( smokes 1/2 PPD x 30 years - ETOH rarely)    (-) COPD and asthma          Patient smoked on day of surgery.                 Cardiovascular:Negative CV ROS  Exercise tolerance: good (>4 METS)  (+) hypertension: no interval change, dysrhythmias:    (-) pacemaker, past MI, CABG/stent and  CHF    ECG reviewed  Rhythm: regular  Rate: normal           Beta Blocker:  Not on Beta Blocker         Neuro/Psych:   (+) psychiatric history:depression/anxiety    (-) seizures, TIA and CVA           GI/Hepatic/Renal:   (+) morbid obesity     (-) GERD, liver disease and no renal disease       Endo/Other:    (+) malignancy/cancer ( Vulvar carcinoma - had excision ).    (-) diabetes mellitus, hypothyroidism, hyperthyroidism  Abdominal:   (+) obese          Vascular: negative vascular ROS.    - DVT and PE.      Other Findings: Pt denies recent CP or change in cardiac status.          Anesthesia Plan      general     ASA 3       Induction: intravenous.    MIPS: Postoperative opioids intended and Prophylactic antiemetics administered.  Anesthetic plan and risks discussed with patient.    Use of blood products discussed with patient whom.    Plan discussed with CRNA.                Anesthesia consult note done  Charlie Inga Raddle, MD   09/09/2023
